# Patient Record
Sex: Female | Born: 1975
Health system: Southern US, Community
[De-identification: ages and names within clinical notes are randomized; demographics above are authoritative.]

## PROBLEM LIST (undated history)

## (undated) DIAGNOSIS — I10 Essential (primary) hypertension: Secondary | ICD-10-CM

## (undated) DIAGNOSIS — F419 Anxiety disorder, unspecified: Secondary | ICD-10-CM

## (undated) DIAGNOSIS — J45909 Unspecified asthma, uncomplicated: Secondary | ICD-10-CM

## (undated) HISTORY — DX: Unspecified asthma, uncomplicated: J45.909

---

## 1997-07-29 ENCOUNTER — Emergency Department (HOSPITAL_COMMUNITY): Admission: EM | Admit: 1997-07-29 | Discharge: 1997-07-29 | Payer: Self-pay | Admitting: Emergency Medicine

## 1997-08-01 ENCOUNTER — Emergency Department (HOSPITAL_COMMUNITY): Admission: EM | Admit: 1997-08-01 | Discharge: 1997-08-01 | Payer: Self-pay | Admitting: Emergency Medicine

## 2000-09-12 ENCOUNTER — Other Ambulatory Visit: Admission: RE | Admit: 2000-09-12 | Discharge: 2000-09-12 | Payer: Self-pay | Admitting: *Deleted

## 2000-10-05 ENCOUNTER — Emergency Department (HOSPITAL_COMMUNITY): Admission: EM | Admit: 2000-10-05 | Discharge: 2000-10-05 | Payer: Self-pay | Admitting: Emergency Medicine

## 2000-10-21 ENCOUNTER — Emergency Department (HOSPITAL_COMMUNITY): Admission: EM | Admit: 2000-10-21 | Discharge: 2000-10-21 | Payer: Self-pay | Admitting: Emergency Medicine

## 2000-10-21 ENCOUNTER — Encounter: Payer: Self-pay | Admitting: Emergency Medicine

## 2001-12-15 ENCOUNTER — Other Ambulatory Visit: Admission: RE | Admit: 2001-12-15 | Discharge: 2001-12-15 | Payer: Self-pay | Admitting: Obstetrics and Gynecology

## 2002-12-09 ENCOUNTER — Other Ambulatory Visit: Admission: RE | Admit: 2002-12-09 | Discharge: 2002-12-09 | Payer: Self-pay | Admitting: Obstetrics and Gynecology

## 2004-02-15 ENCOUNTER — Other Ambulatory Visit: Admission: RE | Admit: 2004-02-15 | Discharge: 2004-02-15 | Payer: Self-pay | Admitting: Obstetrics and Gynecology

## 2005-03-19 ENCOUNTER — Other Ambulatory Visit: Admission: RE | Admit: 2005-03-19 | Discharge: 2005-03-19 | Payer: Self-pay | Admitting: Obstetrics and Gynecology

## 2005-04-05 ENCOUNTER — Encounter: Admission: RE | Admit: 2005-04-05 | Discharge: 2005-04-05 | Payer: Self-pay | Admitting: Nurse Practitioner

## 2006-11-04 ENCOUNTER — Emergency Department (HOSPITAL_COMMUNITY): Admission: EM | Admit: 2006-11-04 | Discharge: 2006-11-05 | Payer: Self-pay | Admitting: Emergency Medicine

## 2006-11-04 IMAGING — CR DG CHEST 2V
2 series · 2 of 2 positions shown · non-contrast
Comparison: None.

CLINICAL DATA: Chest pain. Back pain. Cough.

CHEST - 2 VIEW

[w chest pa]
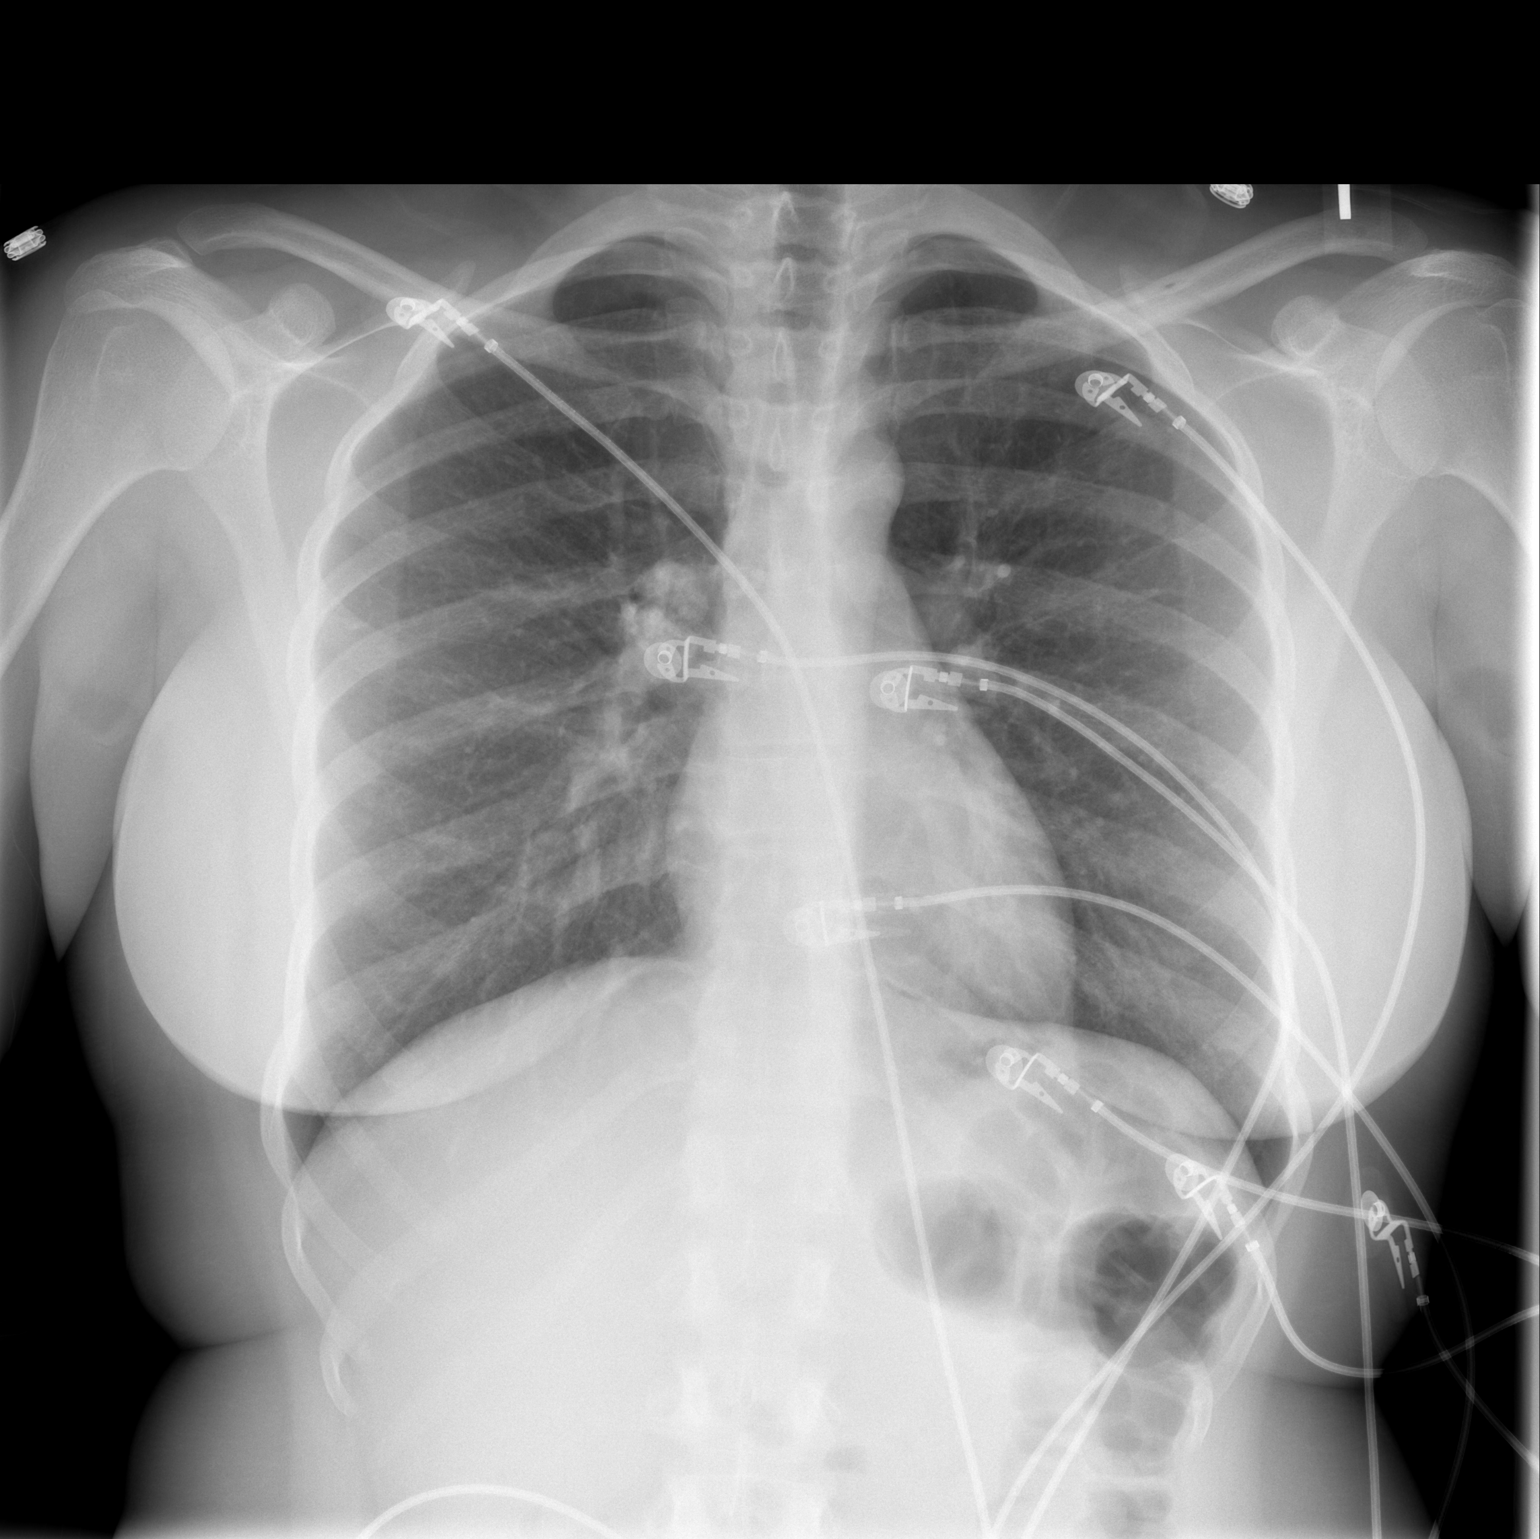

[w chest lat]
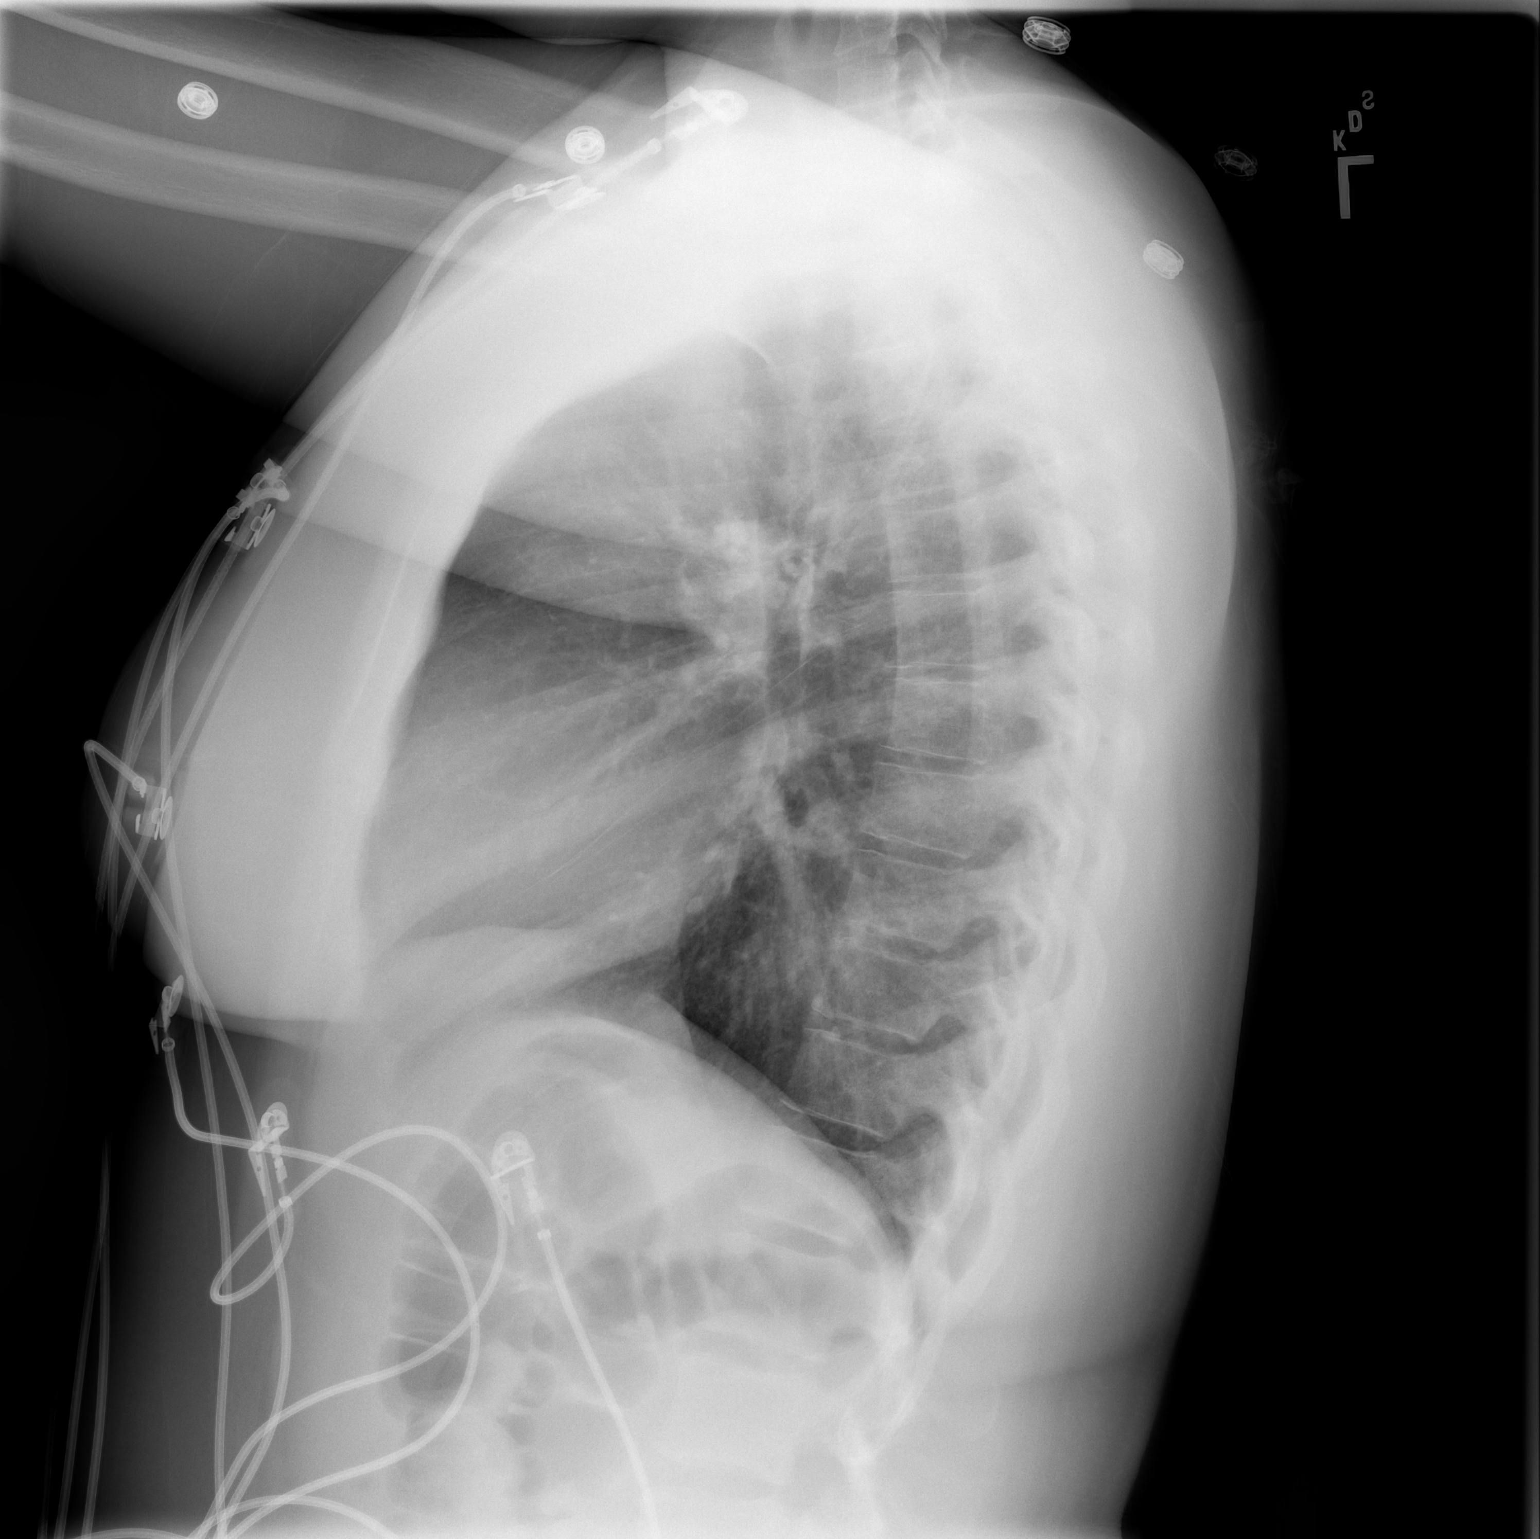

[2 of 2 positions shown; findings below may reference images not displayed]

FINDINGS: Normal sized heart. Calcified proximal right hilar/azygos lymph
nodes. Probable small calcified granuloma in the lateral aspect of the right
upper lobe, inferiorly. No airspace consolidation. Unremarkable bones.

IMPRESSION

Previous granulomatous infection. No acute abnormality.

## 2008-05-28 ENCOUNTER — Emergency Department (HOSPITAL_COMMUNITY): Admission: EM | Admit: 2008-05-28 | Discharge: 2008-05-28 | Payer: Self-pay | Admitting: Emergency Medicine

## 2008-05-28 IMAGING — CT CT HEAD W/O CM
1 of 2 series · 13 of 30 positions shown, 17 images · non-contrast
Comparison: None

CLINICAL DATA: Nausea.  Dizziness.

CT HEAD WITHOUT CONTRAST
TECHNIQUE: Contiguous axial images were obtained from the base of
the skull through the vertex without contrast

[Series 2: brain · axial · 0.47mm/px · z∈[+112,+244]mm · 13 of 40 slices shown, 17 images]
[im 3/40  brain]
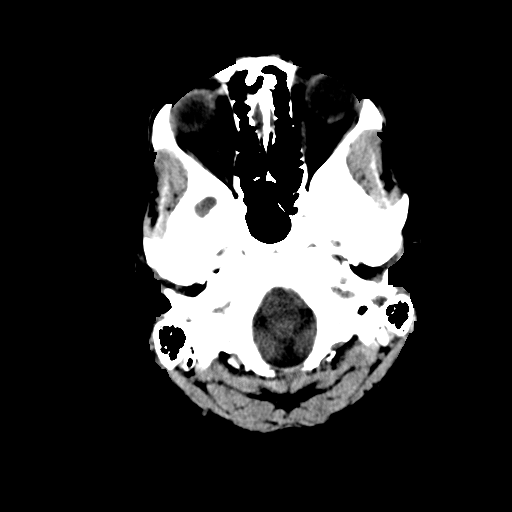
[im 3/40  bone]
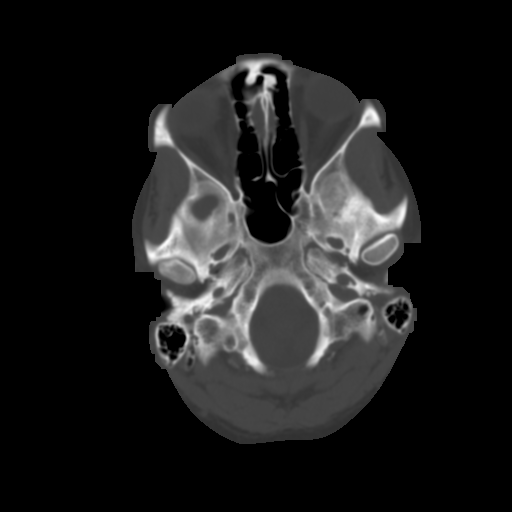
[im 6/40  brain]
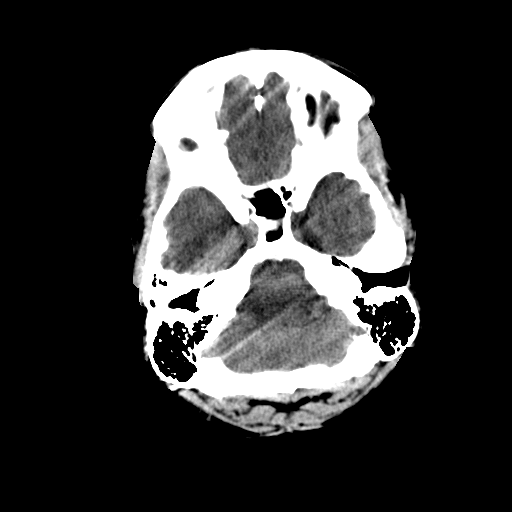
[im 9/40  brain]
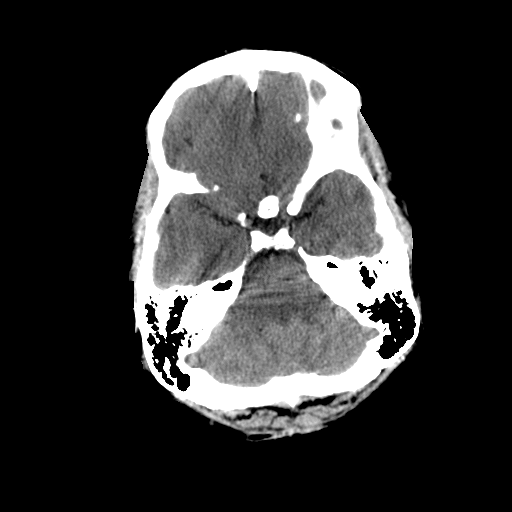
[im 12/40  brain]
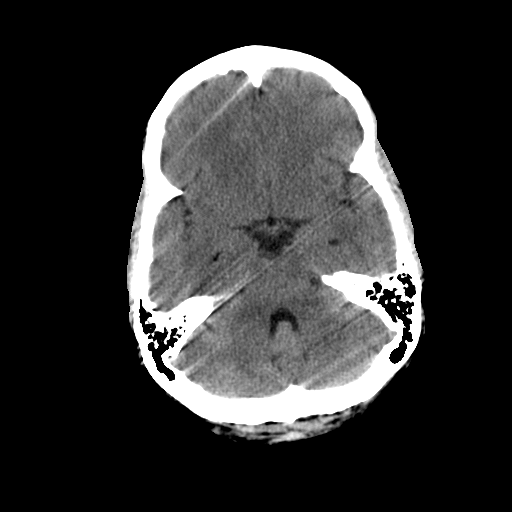
[im 14/40  brain]
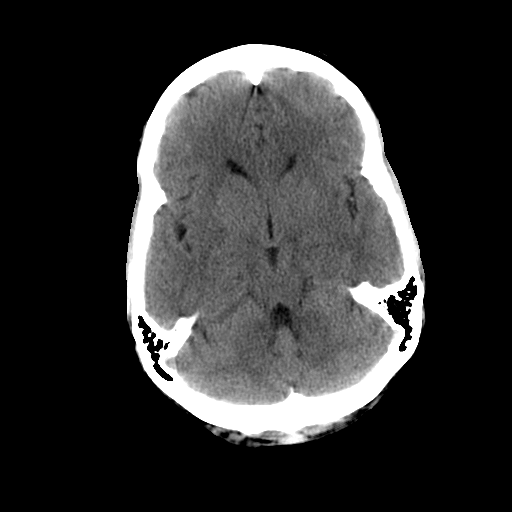
[im 14/40  bone]
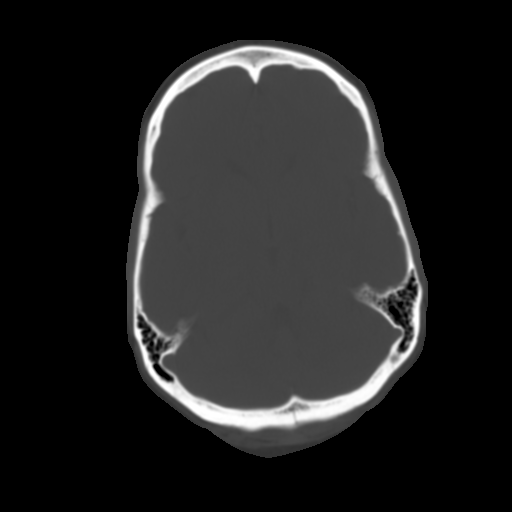
[im 17/40  brain]
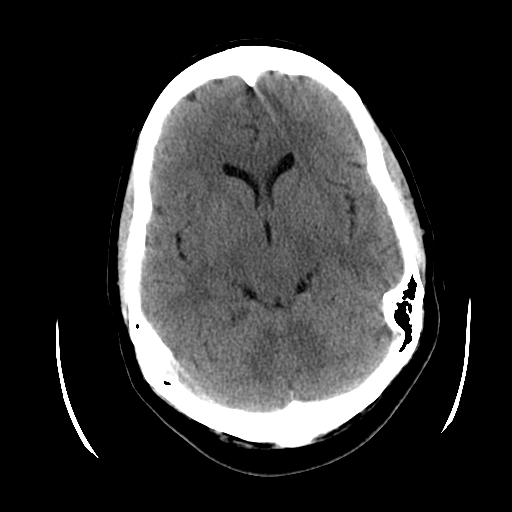
[im 20/40  brain]
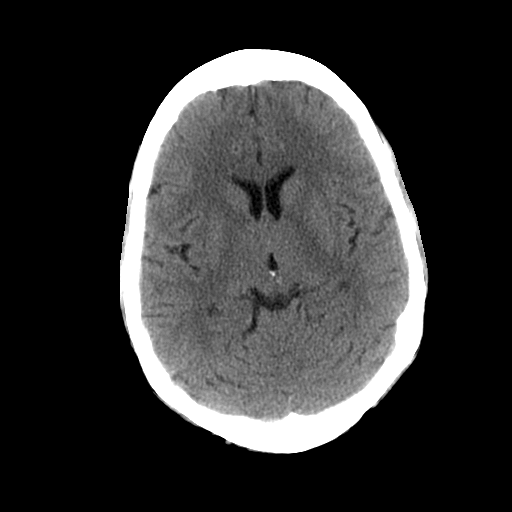
[im 23/40  brain]
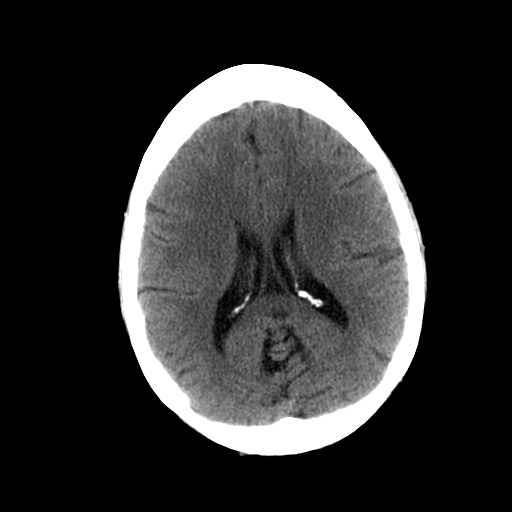
[im 26/40  brain]
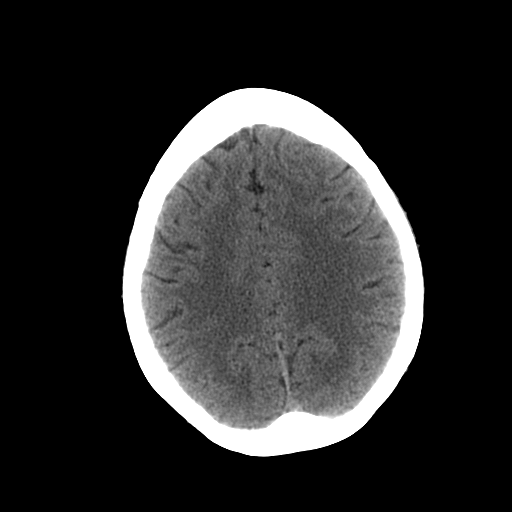
[im 26/40  bone]
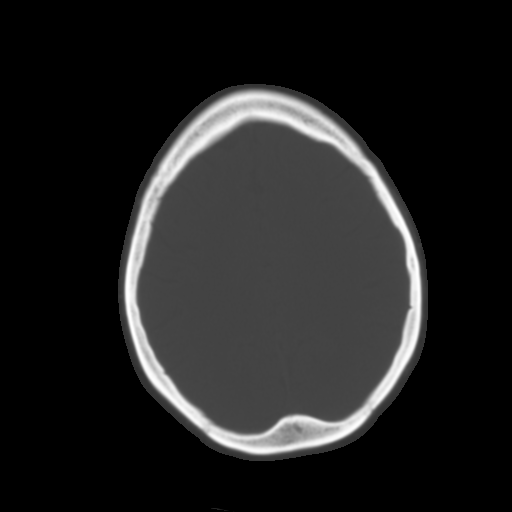
[im 28/40  brain]
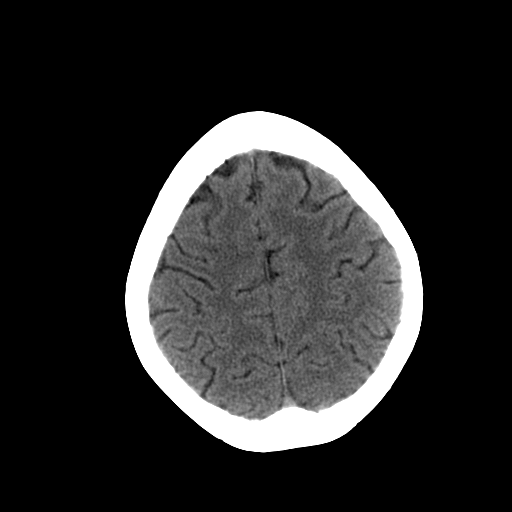
[im 31/40  brain]
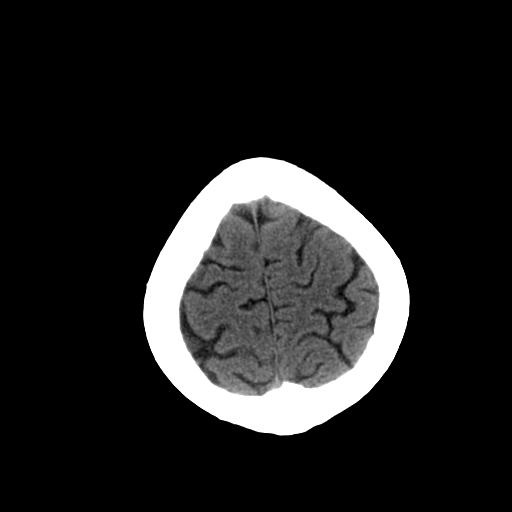
[im 34/40  brain]
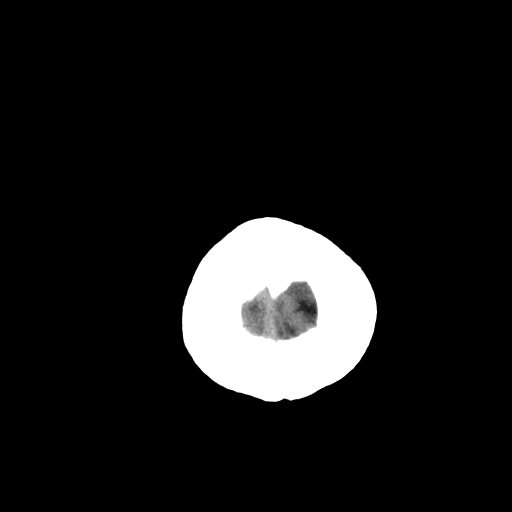
[im 37/40  brain]
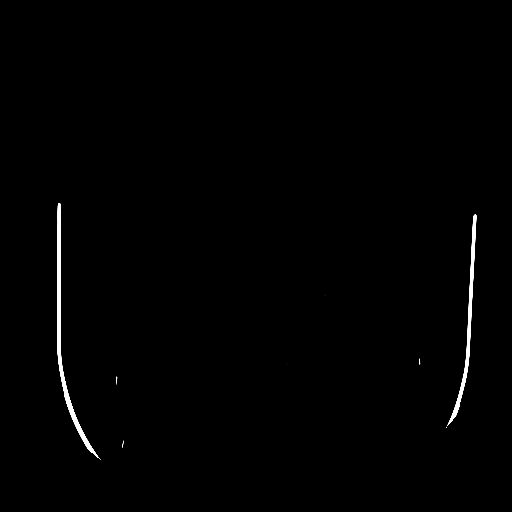
[im 37/40  bone]
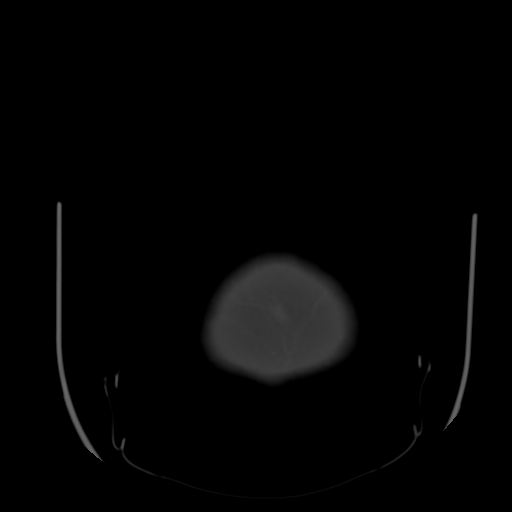

[13 of 30 positions shown; findings below may reference images not displayed]

FINDINGS: The brain has a normal appearance without evidence for
hemorrhage, acute infarction, hydrocephalus, or mass lesion.  There
is no extra axial fluid collection.  The skull and paranasal
sinuses are normal.
IMPRESSION: Normal CT of the head without contrast.

## 2008-06-13 ENCOUNTER — Encounter: Admission: RE | Admit: 2008-06-13 | Discharge: 2008-06-13 | Payer: Self-pay | Admitting: Family Medicine

## 2008-06-13 IMAGING — US US ABDOMEN COMPLETE
1 series · 14 of 25 positions shown · non-contrast
Comparison: None

CLINICAL DATA: Nausea, gallstones.

COMPLETE ABDOMINAL ULTRASOUND

[Series 1: us abdomen complete · 0.24mm/px · 14 of 70 slices shown]
[im 1/70]
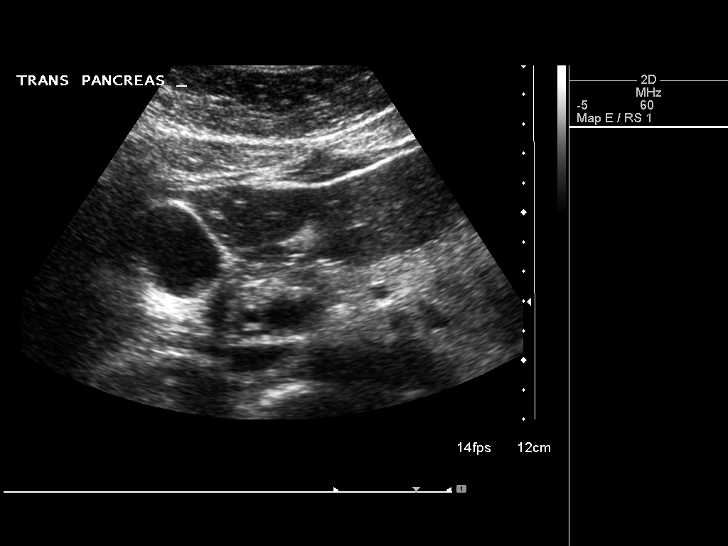
[im 6/70]
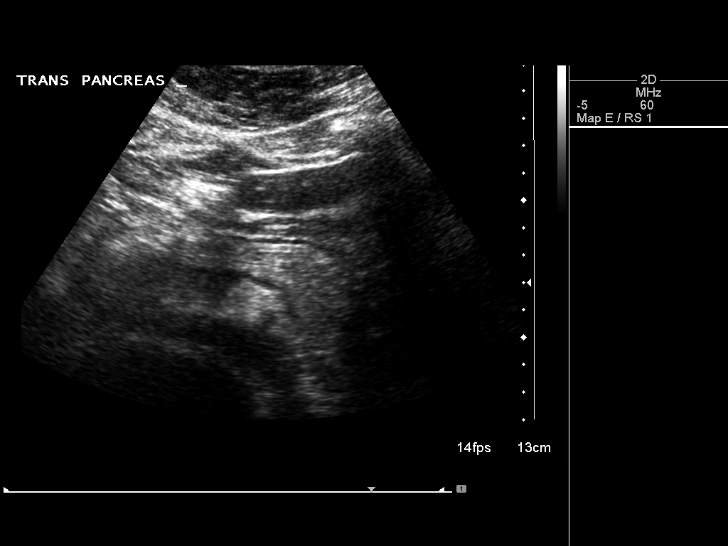
[im 12/70]
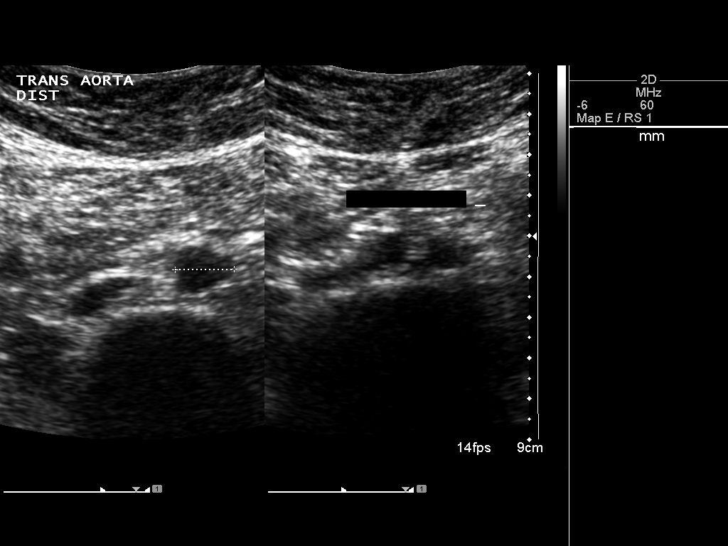
[im 18/70]
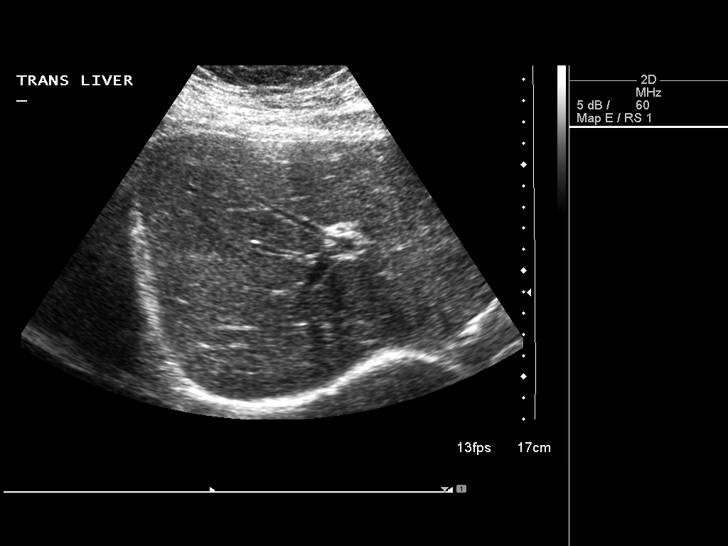
[im 24/70]
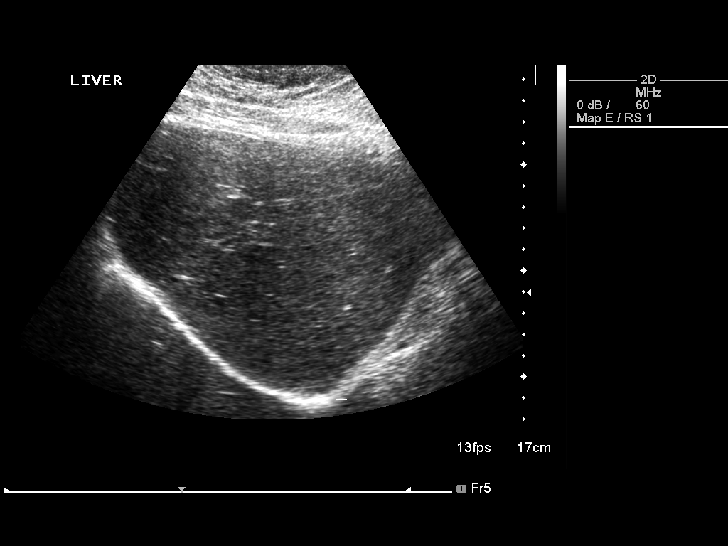
[im 26/70]
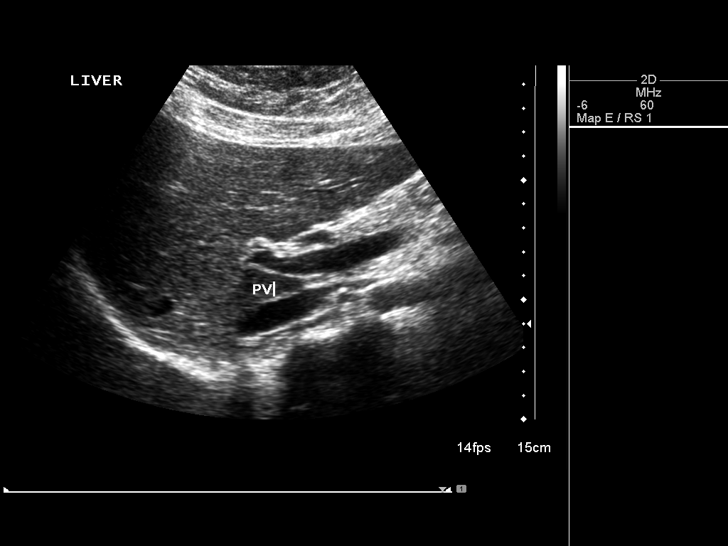
[im 32/70]
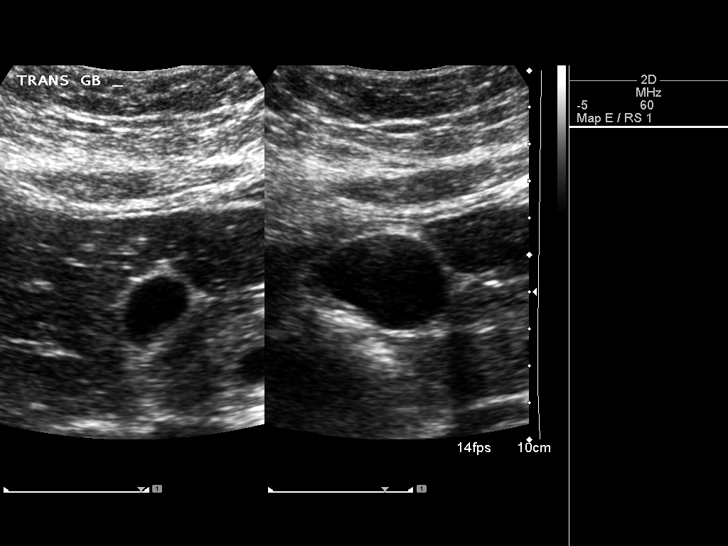
[im 38/70]
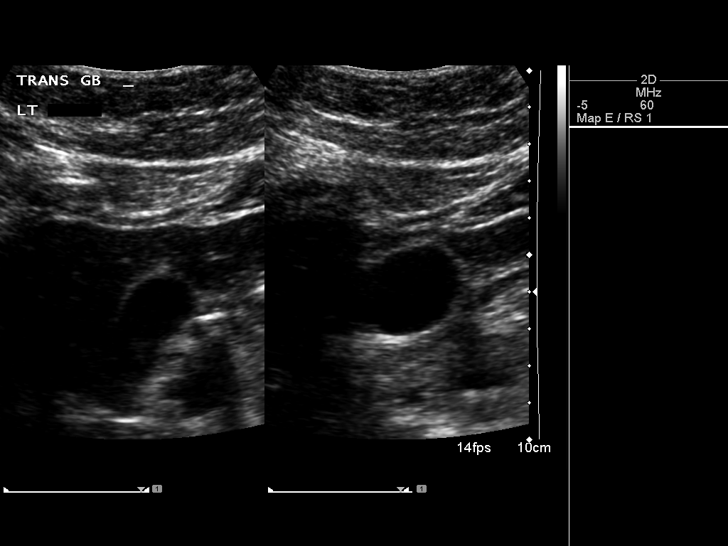
[im 44/70]
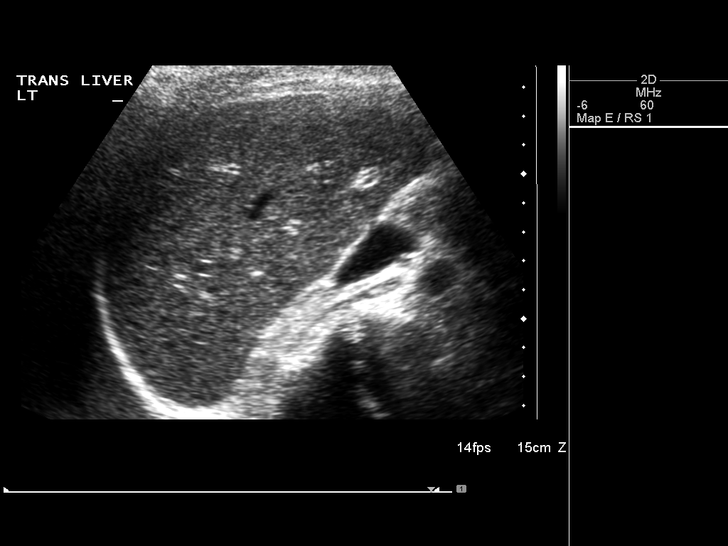
[im 47/70]
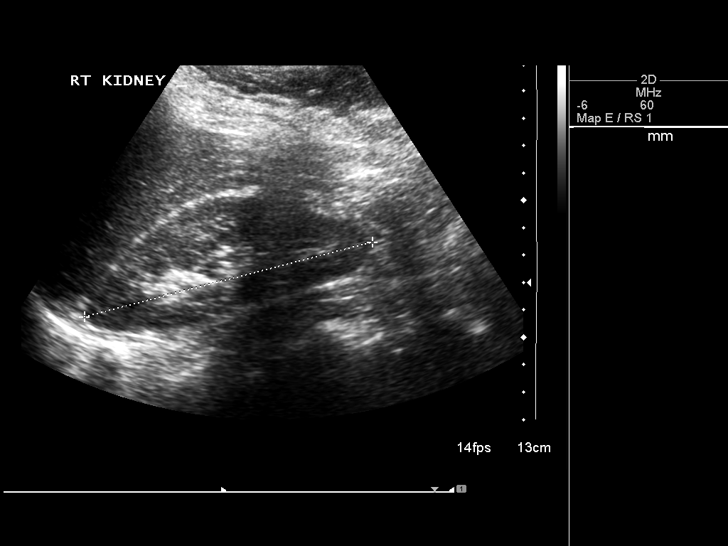
[im 52/70]
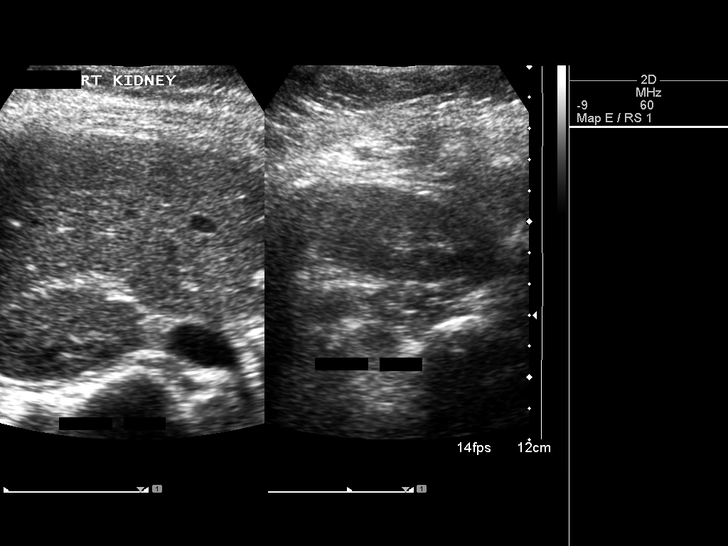
[im 58/70]
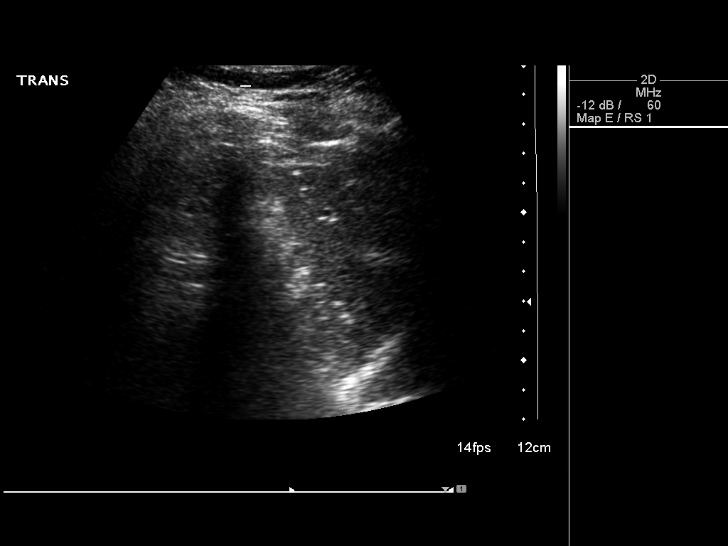
[im 64/70]
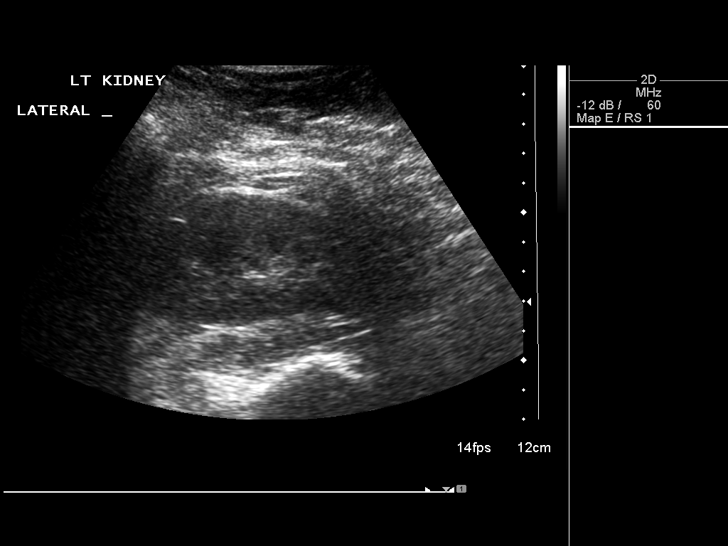
[im 70/70]
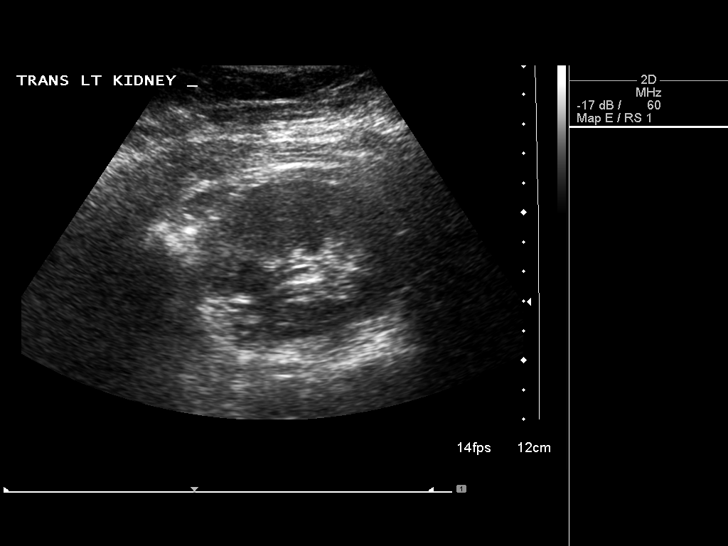

[14 of 25 positions shown; findings below may reference images not displayed]

FINDINGS: Gallbladder:  Negative.

Common bile duct:  4 mm, within normal limits.

Liver:  Negative.

IVC:  Visualized.

Pancreas:  Negative.

Spleen:  4.1 cm.  Contains calcified granulomas.

Right Kidney:  10.9 cm, negative.

Left Kidney:  10.9 cm, negative.

Abdominal aorta:  Measures up to 2.1 cm.
IMPRESSION: No acute findings.

## 2008-06-18 ENCOUNTER — Emergency Department (HOSPITAL_COMMUNITY): Admission: EM | Admit: 2008-06-18 | Discharge: 2008-06-18 | Payer: Self-pay | Admitting: Emergency Medicine

## 2008-08-12 ENCOUNTER — Ambulatory Visit (HOSPITAL_COMMUNITY): Admission: RE | Admit: 2008-08-12 | Discharge: 2008-08-12 | Payer: Self-pay | Admitting: Gastroenterology

## 2010-04-17 LAB — URINALYSIS, ROUTINE W REFLEX MICROSCOPIC
Leukocytes, UA: NEGATIVE
Nitrite: NEGATIVE
Specific Gravity, Urine: 1.01 (ref 1.005–1.030)
Urobilinogen, UA: 1 mg/dL (ref 0.0–1.0)

## 2010-04-17 LAB — DIFFERENTIAL
Basophils Absolute: 0 10*3/uL (ref 0.0–0.1)
Lymphocytes Relative: 26 % (ref 12–46)
Neutro Abs: 6.1 10*3/uL (ref 1.7–7.7)
Neutrophils Relative %: 66 % (ref 43–77)

## 2010-04-17 LAB — URINE MICROSCOPIC-ADD ON

## 2010-04-17 LAB — POCT I-STAT, CHEM 8
BUN: 6 mg/dL (ref 6–23)
Chloride: 108 mEq/L (ref 96–112)
HCT: 40 % (ref 36.0–46.0)
Potassium: 3.8 mEq/L (ref 3.5–5.1)
Sodium: 137 mEq/L (ref 135–145)

## 2010-04-17 LAB — POCT PREGNANCY, URINE: Preg Test, Ur: NEGATIVE

## 2010-04-17 LAB — CBC
Hemoglobin: 12.9 g/dL (ref 12.0–15.0)
Platelets: 345 10*3/uL (ref 150–400)
RDW: 12.3 % (ref 11.5–15.5)

## 2010-10-17 LAB — D-DIMER, QUANTITATIVE: D-Dimer, Quant: 0.31

## 2015-06-15 ENCOUNTER — Encounter (HOSPITAL_COMMUNITY): Payer: Self-pay

## 2015-06-15 ENCOUNTER — Emergency Department (HOSPITAL_COMMUNITY): Payer: 59

## 2015-06-15 DIAGNOSIS — R202 Paresthesia of skin: Secondary | ICD-10-CM | POA: Diagnosis not present

## 2015-06-15 DIAGNOSIS — Z79899 Other long term (current) drug therapy: Secondary | ICD-10-CM | POA: Insufficient documentation

## 2015-06-15 DIAGNOSIS — F419 Anxiety disorder, unspecified: Secondary | ICD-10-CM | POA: Diagnosis not present

## 2015-06-15 DIAGNOSIS — I10 Essential (primary) hypertension: Secondary | ICD-10-CM | POA: Diagnosis not present

## 2015-06-15 DIAGNOSIS — R072 Precordial pain: Secondary | ICD-10-CM | POA: Insufficient documentation

## 2015-06-15 LAB — DIFFERENTIAL
BASOS ABS: 0 10*3/uL (ref 0.0–0.1)
Basophils Relative: 0 %
EOS ABS: 0.3 10*3/uL (ref 0.0–0.7)
EOS PCT: 3 %
LYMPHS ABS: 4.6 10*3/uL — AB (ref 0.7–4.0)
LYMPHS PCT: 44 %
Monocytes Absolute: 0.5 10*3/uL (ref 0.1–1.0)
Monocytes Relative: 5 %
NEUTROS PCT: 48 %
Neutro Abs: 5.1 10*3/uL (ref 1.7–7.7)

## 2015-06-15 LAB — COMPREHENSIVE METABOLIC PANEL
ALBUMIN: 4.1 g/dL (ref 3.5–5.0)
ALT: 10 U/L — ABNORMAL LOW (ref 14–54)
ANION GAP: 12 (ref 5–15)
AST: 15 U/L (ref 15–41)
Alkaline Phosphatase: 90 U/L (ref 38–126)
BILIRUBIN TOTAL: 0.4 mg/dL (ref 0.3–1.2)
CO2: 20 mmol/L — AB (ref 22–32)
Calcium: 9.7 mg/dL (ref 8.9–10.3)
Chloride: 103 mmol/L (ref 101–111)
Creatinine, Ser: 0.8 mg/dL (ref 0.44–1.00)
GFR calc Af Amer: >60 mL/min (ref >60)
GFR calc non Af Amer: >60 mL/min (ref >60)
GLUCOSE: 87 mg/dL (ref 65–99)
POTASSIUM: 3.3 mmol/L — AB (ref 3.5–5.1)
SODIUM: 135 mmol/L (ref 135–145)
TOTAL PROTEIN: 7.7 g/dL (ref 6.5–8.1)

## 2015-06-15 LAB — CBC
HCT: 38.9 % (ref 36.0–46.0)
HEMOGLOBIN: 12.7 g/dL (ref 12.0–15.0)
MCH: 26.7 pg (ref 26.0–34.0)
MCHC: 32.6 g/dL (ref 30.0–36.0)
MCV: 81.7 fL (ref 78.0–100.0)
PLATELETS: 403 10*3/uL — AB (ref 150–400)
RBC: 4.76 MIL/uL (ref 3.87–5.11)
RDW: 12.3 % (ref 11.5–15.5)
WBC: 10.5 10*3/uL (ref 4.0–10.5)

## 2015-06-15 LAB — I-STAT CHEM 8, ED
BUN: 5 mg/dL — AB (ref 6–20)
CREATININE: 0.7 mg/dL (ref 0.44–1.00)
Calcium, Ion: 1.13 mmol/L (ref 1.12–1.23)
Chloride: 103 mmol/L (ref 101–111)
Glucose, Bld: 83 mg/dL (ref 65–99)
HEMATOCRIT: 42 % (ref 36.0–46.0)
HEMOGLOBIN: 14.3 g/dL (ref 12.0–15.0)
POTASSIUM: 3.1 mmol/L — AB (ref 3.5–5.1)
Sodium: 139 mmol/L (ref 135–145)
TCO2: 21 mmol/L (ref 0–100)

## 2015-06-15 LAB — APTT: APTT: 31 s (ref 24–37)

## 2015-06-15 LAB — PROTIME-INR
INR: 1.03 (ref 0.00–1.49)
PROTHROMBIN TIME: 13.7 s (ref 11.6–15.2)

## 2015-06-15 LAB — I-STAT TROPONIN, ED: Troponin i, poc: 0 ng/mL (ref 0.00–0.08)

## 2015-06-15 IMAGING — CT CT HEAD W/O CM
3 series · 15 of 47 positions shown, 18 images · non-contrast
Comparison: Head CT dated [DATE]

CLINICAL DATA: 39-year-old female with left-sided numbness

EXAM:
CT HEAD WITHOUT CONTRAST
TECHNIQUE: Contiguous axial images were obtained from the base of the skull
through the vertex without intravenous contrast.

[Series 2: head 5.0 st · axial · 0.43mm/px · z∈[-157,-27]mm · 9 of 32 slices shown, 12 images]
[im 3/32  brain]
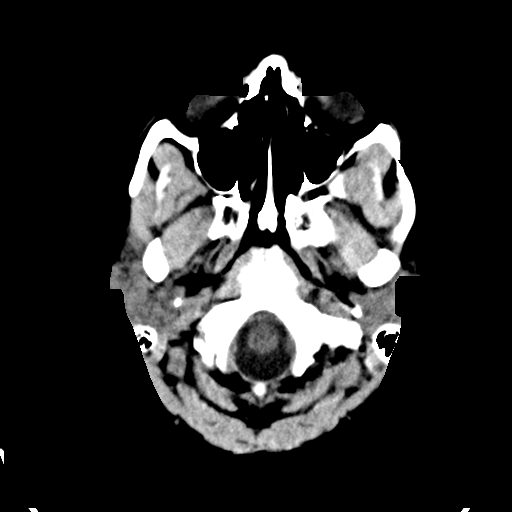
[im 3/32  bone]
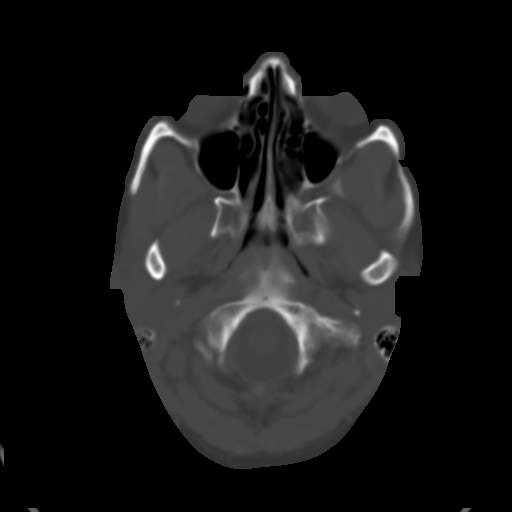
[im 6/32  brain]
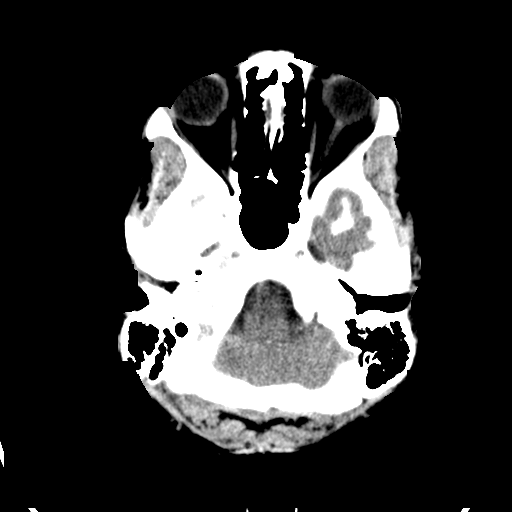
[im 9/32  brain]
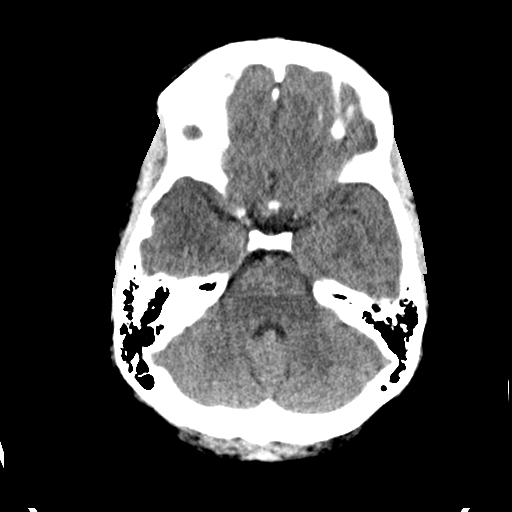
[im 12/32  brain]
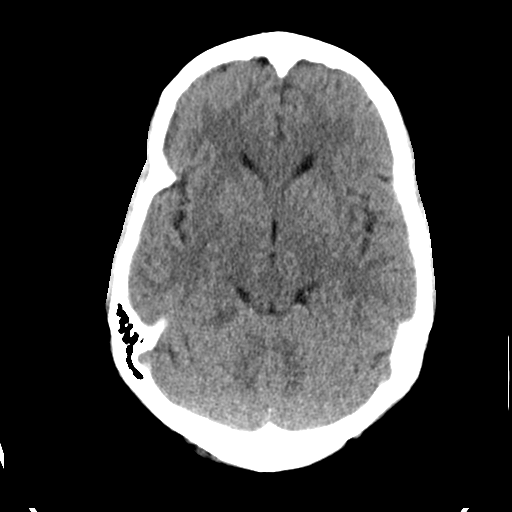
[im 17/32  brain]
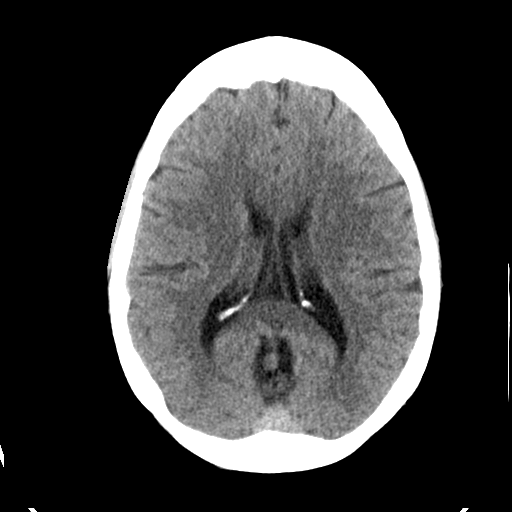
[im 17/32  bone]
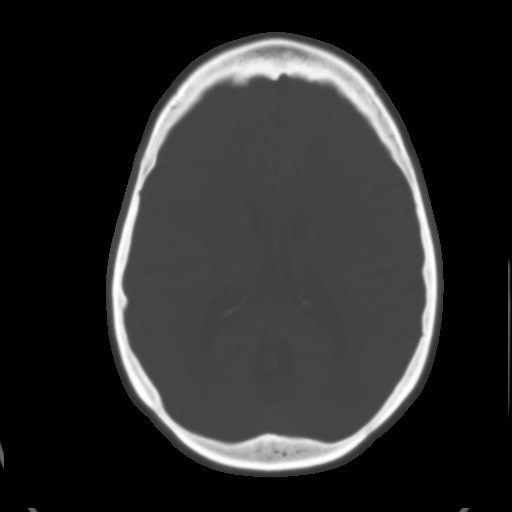
[im 20/32  brain]
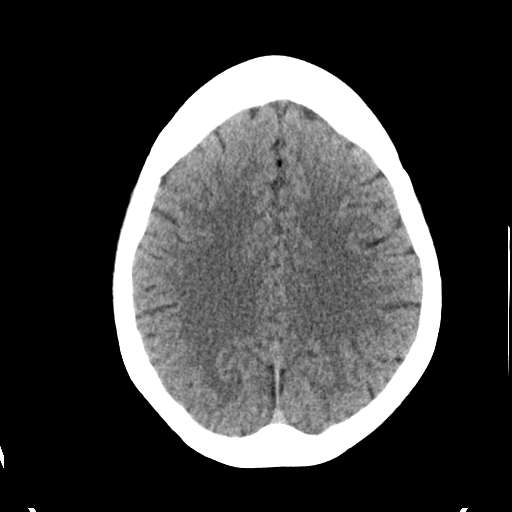
[im 23/32  brain]
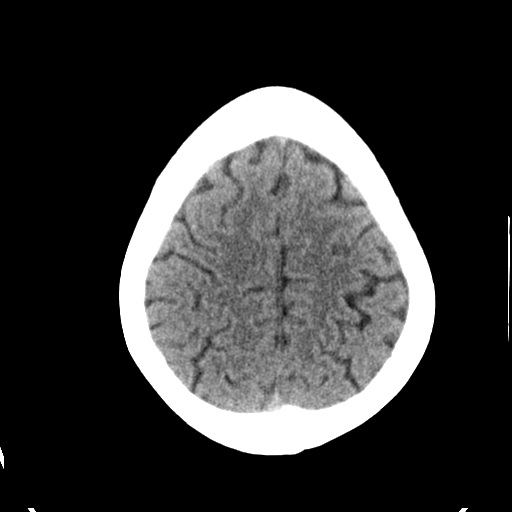
[im 26/32  brain]
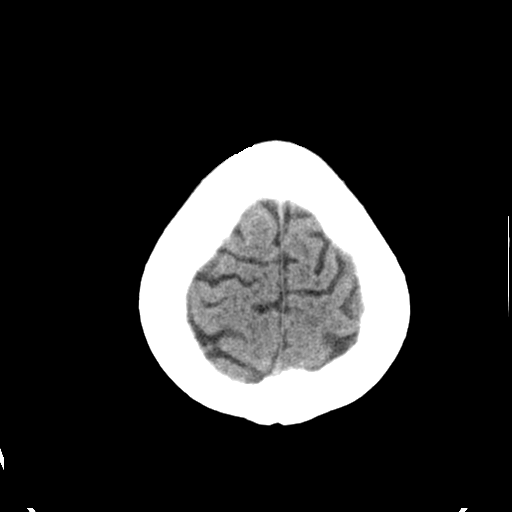
[im 29/32  brain]
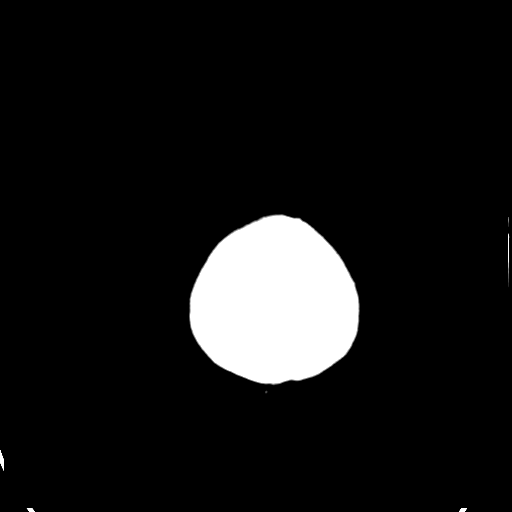
[im 29/32  bone]
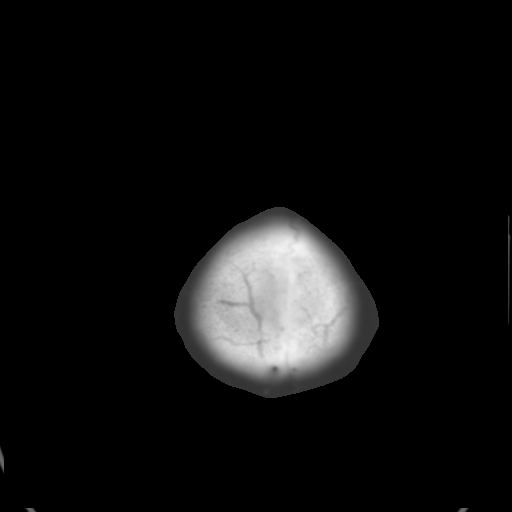

[Series 4: head 3.0 cor st · coronal · 0.31mm/px · 3 of 69 slices shown]
[im 23/69  brain]
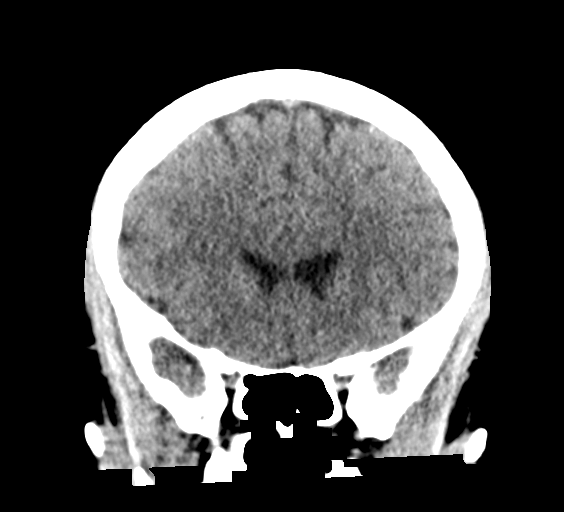
[im 31/69  brain]
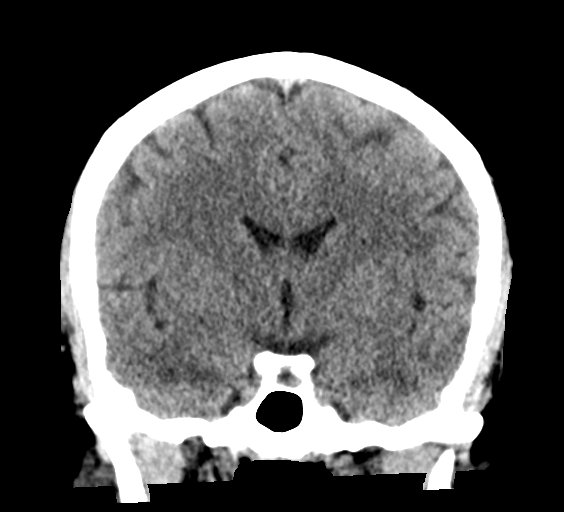
[im 38/69  brain]
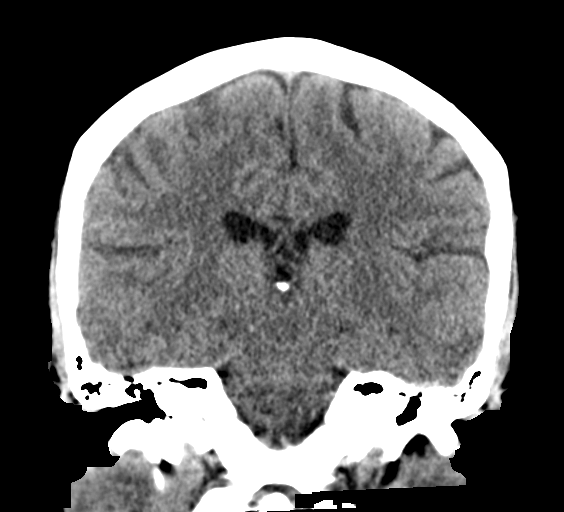

[Series 5: head 3.0 sag st · sagittal · 0.32mm/px · 3 of 67 slices shown]
[im 23/67  brain]
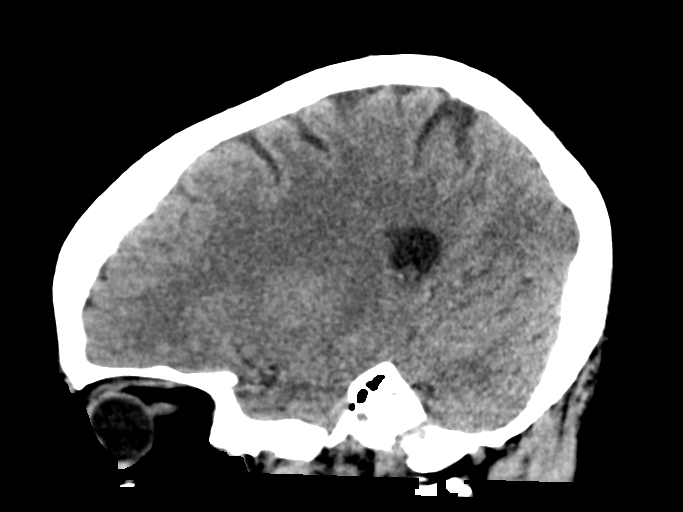
[im 34/67  brain]
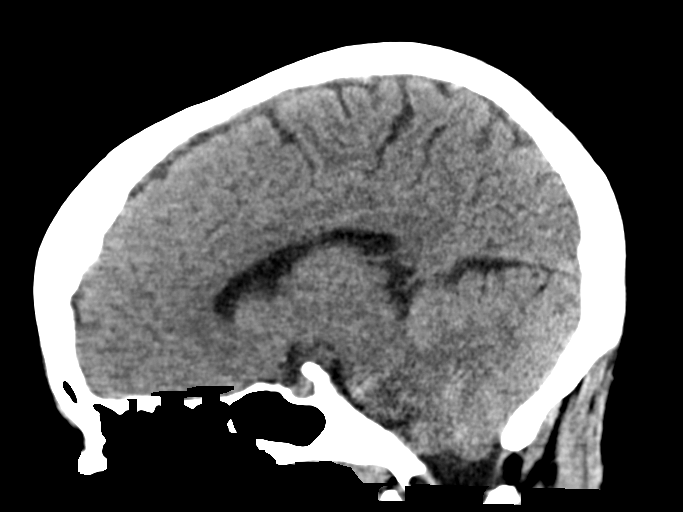
[im 45/67  brain]
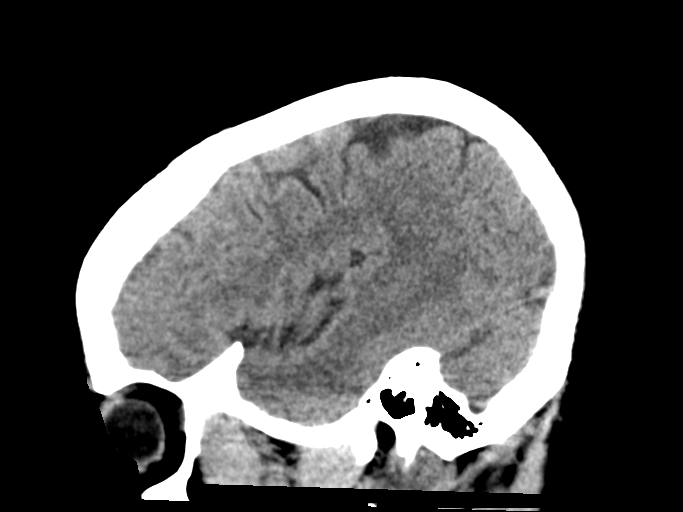

[15 of 47 positions shown; findings below may reference images not displayed]

FINDINGS: The ventricles and the sulci are appropriate in size for the
patient's age. There is no intracranial hemorrhage. No midline shift
or mass effect identified. The gray-white matter differentiation is
preserved.

The visualized paranasal sinuses and mastoid air cells are well
aerated. The calvarium is intact.
IMPRESSION: No acute intracranial pathology

## 2015-06-15 NOTE — ED Notes (Signed)
Pt states that at 8am the left side of her body started to feel numb. She went to work anyway and started to have chest pressure to center of chest. She reports she stills feels tingly but does have anxiety.

## 2015-06-16 ENCOUNTER — Emergency Department (HOSPITAL_COMMUNITY)
Admission: EM | Admit: 2015-06-16 | Discharge: 2015-06-16 | Disposition: A | Discharge status: Home / Self Care | Payer: 59 | Attending: Emergency Medicine | Admitting: Emergency Medicine

## 2015-06-16 DIAGNOSIS — F419 Anxiety disorder, unspecified: Secondary | ICD-10-CM

## 2015-06-16 DIAGNOSIS — R079 Chest pain, unspecified: Secondary | ICD-10-CM

## 2015-06-16 HISTORY — DX: Essential (primary) hypertension: I10

## 2015-06-16 HISTORY — DX: Anxiety disorder, unspecified: F41.9

## 2015-06-16 MED ORDER — ALPRAZOLAM 0.5 MG PO TABS: 0.5000 mg | ORAL_TABLET | Freq: Three times a day (TID) | ORAL | Status: DC | PRN

## 2015-06-16 MED ORDER — GI COCKTAIL ~~LOC~~
30.0000 mL | Freq: Once | ORAL | Status: AC
  Administered 2015-06-16: 30 mL via ORAL
  Filled 2015-06-16: qty 30

## 2015-06-16 NOTE — Discharge Instructions (Signed)
Nonspecific Chest Pain  °Chest pain can be caused by many different conditions. There is always a chance that your pain could be related to something serious, such as a heart attack or a blood clot in your lungs. Chest pain can also be caused by conditions that are not life-threatening. If you have chest pain, it is very important to follow up with your health care provider. °CAUSES  °Chest pain can be caused by: °· Heartburn. °· Pneumonia or bronchitis. °· Anxiety or stress. °· Inflammation around your heart (pericarditis) or lung (pleuritis or pleurisy). °· A blood clot in your lung. °· A collapsed lung (pneumothorax). It can develop suddenly on its own (spontaneous pneumothorax) or from trauma to the chest. °· Shingles infection (varicella-zoster virus). °· Heart attack. °· Damage to the bones, muscles, and cartilage that make up your chest wall. This can include: °· Bruised bones due to injury. °· Strained muscles or cartilage due to frequent or repeated coughing or overwork. °· Fracture to one or more ribs. °· Sore cartilage due to inflammation (costochondritis). °RISK FACTORS  °Risk factors for chest pain may include: °· Activities that increase your risk for trauma or injury to your chest. °· Respiratory infections or conditions that cause frequent coughing. °· Medical conditions or overeating that can cause heartburn. °· Heart disease or family history of heart disease. °· Conditions or health behaviors that increase your risk of developing a blood clot. °· Having had chicken pox (varicella zoster). °SIGNS AND SYMPTOMS °Chest pain can feel like: °· Burning or tingling on the surface of your chest or deep in your chest. °· Crushing, pressure, aching, or squeezing pain. °· Dull or sharp pain that is worse when you move, cough, or take a deep breath. °· Pain that is also felt in your back, neck, shoulder, or arm, or pain that spreads to any of these areas. °Your chest pain may come and go, or it may stay  constant. °DIAGNOSIS °Lab tests or other studies may be needed to find the cause of your pain. Your health care provider may have you take a test called an ambulatory ECG (electrocardiogram). An ECG records your heartbeat patterns at the time the test is performed. You may also have other tests, such as: °· Transthoracic echocardiogram (TTE). During echocardiography, sound waves are used to create a picture of all of the heart structures and to look at how blood flows through your heart. °· Transesophageal echocardiogram (TEE). This is a more advanced imaging test that obtains images from inside your body. It allows your health care provider to see your heart in finer detail. °· Cardiac monitoring. This allows your health care provider to monitor your heart rate and rhythm in real time. °· Holter monitor. This is a portable device that records your heartbeat and can help to diagnose abnormal heartbeats. It allows your health care provider to track your heart activity for several days, if needed. °· Stress tests. These can be done through exercise or by taking medicine that makes your heart beat more quickly. °· Blood tests. °· Imaging tests. °TREATMENT  °Your treatment depends on what is causing your chest pain. Treatment may include: °· Medicines. These may include: °· Acid blockers for heartburn. °· Anti-inflammatory medicine. °· Pain medicine for inflammatory conditions. °· Antibiotic medicine, if an infection is present. °· Medicines to dissolve blood clots. °· Medicines to treat coronary artery disease. °· Supportive care for conditions that do not require medicines. This may include: °· Resting. °· Applying heat   or cold packs to injured areas. °· Limiting activities until pain decreases. °HOME CARE INSTRUCTIONS °· If you were prescribed an antibiotic medicine, finish it all even if you start to feel better. °· Avoid any activities that bring on chest pain. °· Do not use any tobacco products, including  cigarettes, chewing tobacco, or electronic cigarettes. If you need help quitting, ask your health care provider. °· Do not drink alcohol. °· Take medicines only as directed by your health care provider. °· Keep all follow-up visits as directed by your health care provider. This is important. This includes any further testing if your chest pain does not go away. °· If heartburn is the cause for your chest pain, you may be told to keep your head raised (elevated) while sleeping. This reduces the chance that acid will go from your stomach into your esophagus. °· Make lifestyle changes as directed by your health care provider. These may include: °· Getting regular exercise. Ask your health care provider to suggest some activities that are safe for you. °· Eating a heart-healthy diet. A registered dietitian can help you to learn healthy eating options. °· Maintaining a healthy weight. °· Managing diabetes, if necessary. °· Reducing stress. °SEEK MEDICAL CARE IF: °· Your chest pain does not go away after treatment. °· You have a rash with blisters on your chest. °· You have a fever. °SEEK IMMEDIATE MEDICAL CARE IF:  °· Your chest pain is worse. °· You have an increasing cough, or you cough up blood. °· You have severe abdominal pain. °· You have severe weakness. °· You faint. °· You have chills. °· You have sudden, unexplained chest discomfort. °· You have sudden, unexplained discomfort in your arms, back, neck, or jaw. °· You have shortness of breath at any time. °· You suddenly start to sweat, or your skin gets clammy. °· You feel nauseous or you vomit. °· You suddenly feel light-headed or dizzy. °· Your heart begins to beat quickly, or it feels like it is skipping beats. °These symptoms may represent a serious problem that is an emergency. Do not wait to see if the symptoms will go away. Get medical help right away. Call your local emergency services (911 in the [place] Do not drive yourself to the hospital. °  °This  information is not intended to replace advice given to you by your health care provider. Make sure you discuss any questions you have with your health care provider. °  °Document Released: 10/03/2004 Document Revised: 01/14/2014 Document Reviewed: 07/30/2013 °Elsevier Interactive Patient Education ©2016 [employer] ° °Panic Attacks °Panic attacks are sudden, short-lived surges of severe anxiety, fear, or discomfort. They may occur for no reason when you are relaxed, when you are anxious, or when you are sleeping. Panic attacks may occur for a number of reasons:  °· Healthy people occasionally have panic attacks in extreme, life-threatening situations, such as war or natural disasters. Normal anxiety is a protective mechanism of the body that helps us react to danger (fight or flight response). °· Panic attacks are often seen with anxiety disorders, such as panic disorder, social anxiety disorder, generalized anxiety disorder, and phobias. Anxiety disorders cause excessive or uncontrollable anxiety. They may interfere with your relationships or other life activities. °· Panic attacks are sometimes seen with other mental illnesses, such as depression and posttraumatic stress disorder. °· Certain medical conditions, prescription medicines, and drugs of abuse can cause panic attacks. °SYMPTOMS  °Panic attacks start suddenly, peak within 20 minutes, and are   accompanied by four or more of the following symptoms: °· Pounding heart or fast heart rate (palpitations). °· Sweating. °· Trembling or shaking. °· Shortness of breath or feeling smothered. °· Feeling choked. °· Chest pain or discomfort. °· Nausea or strange feeling in your stomach. °· Dizziness, light-headedness, or feeling like you will faint. °· Chills or hot flushes. °· Numbness or tingling in your lips or hands and feet. °· Feeling that things are not real or feeling that you are not yourself. °· Fear of losing control or going crazy. °· Fear of dying. °Some  of these symptoms can mimic serious medical conditions. For example, you may think you are having a heart attack. Although panic attacks can be very scary, they are not life threatening. °DIAGNOSIS  °Panic attacks are diagnosed through an assessment by your health care provider. Your health care provider will ask questions about your symptoms, such as where and when they occurred. Your health care provider will also ask about your medical history and use of alcohol and drugs, including prescription medicines. Your health care provider may order blood tests or other studies to rule out a serious medical condition. Your health care provider may refer you to a mental health professional for further evaluation. °TREATMENT  °· Most healthy people who have one or two panic attacks in an extreme, life-threatening situation will not require treatment. °· The treatment for panic attacks associated with anxiety disorders or other mental illness typically involves counseling with a mental health professional, medicine, or a combination of both. Your health care provider will help determine what treatment is best for you. °· Panic attacks due to physical illness usually go away with treatment of the illness. If prescription medicine is causing panic attacks, talk with your health care provider about stopping the medicine, decreasing the dose, or substituting another medicine. °· Panic attacks due to alcohol or drug abuse go away with abstinence. Some adults need professional help in order to stop drinking or using drugs. °HOME CARE INSTRUCTIONS  °· Take all medicines as directed by your health care provider.   °· Schedule and attend follow-up visits as directed by your health care provider. It is important to keep all your appointments. °SEEK MEDICAL CARE IF: °· You are not able to take your medicines as prescribed. °· Your symptoms do not improve or get worse. °SEEK IMMEDIATE MEDICAL CARE IF:  °· You experience panic attack  symptoms that are different than your usual symptoms. °· You have serious thoughts about hurting yourself or others. °· You are taking medicine for panic attacks and have a serious side effect. °MAKE SURE YOU: °· Understand these instructions. °· Will watch your condition. °· Will get help right away if you are not doing well or get worse. °  °This information is not intended to replace advice given to you by your health care provider. Make sure you discuss any questions you have with your health care provider. °  °Document Released: 12/24/2004 Document Revised: 12/29/2012 Document Reviewed: 08/07/2012 °Elsevier Interactive Patient Education ©2016 [employer] ° °

## 2015-06-16 NOTE — ED Notes (Signed)
Pt c/o being very nervous and anxious

## 2015-06-16 NOTE — ED Notes (Signed)
The pt is being treated for chest pain not a stroke

## 2015-06-16 NOTE — ED Notes (Signed)
The pt continues to be anxious.  She reports maybe a little  better

## 2015-06-16 NOTE — ED Notes (Signed)
The pt is anxious and since this am she has had numbness and tingling in both feet and hands and fingers sl discomfort now

## 2015-06-16 NOTE — ED Notes (Signed)
No tingling and numbness in  Both her hands and feet

## 2015-06-23 NOTE — ED Provider Notes (Signed)
CSN: [id number]     Arrival date & time 06/15/15  2144 History   First MD Initiated Contact with Patient 06/16/15 0117     Chief Complaint  Patient presents with  . Numbness  . Chest Pain     (Consider location/radiation/quality/duration/timing/severity/associated sxs/prior Treatment) HPI Comments: Patient presents with complaint of a tingling sensation to the left side of body that started this morning while at work. She experienced a pressure in her substernal chest at that time as well. Tingling persists moving to bilateral hands and feet. She reports a history of anxiety and that she is out of her medication. No fever, cough, vomiting. Symptoms have been persistent throughout the day.  Patient is a 40 y.o. female presenting with chest pain. The history is provided by the patient. No language interpreter was used.  Chest Pain Pain location:  L chest Associated symptoms: shortness of breath   Associated symptoms: no fever and no nausea     Past Medical History  Diagnosis Date  . Hypertension   . Anxiety    History reviewed. No pertinent past surgical history. No family history on file. Social History  Substance Use Topics  . Smoking status: Never Smoker   . Smokeless tobacco: None  . Alcohol Use: No   OB History    No data available     Review of Systems  Constitutional: Negative for fever and chills.  HENT: Negative.   Respiratory: Positive for shortness of breath.   Cardiovascular: Positive for chest pain.  Gastrointestinal: Negative.  Negative for nausea.  Musculoskeletal: Negative.   Skin: Negative.   Neurological:       Paresthesias.      Allergies  Review of patient's allergies indicates not on file.  Home Medications   Prior to Admission medications   Medication Sig Start Date End Date Taking? Authorizing Provider  ALPRAZolam Duanne Moron) 0.5 MG tablet Take 1 tablet (0.5 mg total) by mouth 3 (three) times daily as needed for anxiety. 06/16/15   Joshus Rogan, PA-C   BP 134/84 mmHg  Pulse 76  Temp(Src) 98.1 F (36.7 C) (Oral)  Resp 18  SpO2 99%  LMP 05/25/2015 Physical Exam  Constitutional: She is oriented to person, place, and time. She appears well-developed and well-nourished.  HENT:  Head: Normocephalic.  Neck: Normal range of motion. Neck supple.  Cardiovascular: Normal rate and regular rhythm.   Pulmonary/Chest: Effort normal and breath sounds normal. She has no wheezes. She has no rales.  Abdominal: Soft. Bowel sounds are normal. There is no tenderness. There is no rebound and no guarding.  Musculoskeletal: Normal range of motion.  Neurological: She is alert and oriented to person, place, and time.  No lateralizing weakness. Normal sensation to bilateral extremities to light touch.  Skin: Skin is warm and dry. No rash noted.  Psychiatric: She has a normal mood and affect.    ED Course  Procedures (including critical care time) Labs Review Labs Reviewed  CBC - Abnormal; Notable for the following:    Platelets 403 (*)    All other components within normal limits  DIFFERENTIAL - Abnormal; Notable for the following:    Lymphs Abs 4.6 (*)    All other components within normal limits  COMPREHENSIVE METABOLIC PANEL - Abnormal; Notable for the following:    Potassium 3.3 (*)    CO2 20 (*)    BUN <5 (*)    ALT 10 (*)    All other components within normal limits  I-STAT  CHEM 8, ED - Abnormal; Notable for the following:    Potassium 3.1 (*)    BUN 5 (*)    All other components within normal limits  PROTIME-INR  APTT  I-STAT TROPOININ, ED  I-STAT TROPOININ, ED    Imaging Review No results found. I have personally reviewed and evaluated these images and lab results as part of my medical decision-making.   EKG Interpretation   Date/Time:  Thursday June 15 2015 21:50:28 EDT Ventricular Rate:  98 PR Interval:  128 QRS Duration: 80 QT Interval:  360 QTC Calculation: 459 R Axis:   81 Text Interpretation:   Normal sinus rhythm Normal ECG When compared with  ECG of 05/28/2008, No significant change was found Confirmed by [facility]  MD,  DAVID ([id number]) on 06/15/2015 9:54:45 PM Also confirmed by Roxanne Mins  MD, DAVID  ([id number]), editor [place] CT, Leda Gauze [phone])  on 06/16/2015 8:28:45 AM      MDM   Final diagnoses:  Chest pain, unspecified chest pain type  Anxiety    The patient presents with chest pain similar to previous anxiety attacks. She is out of medications for same. EKG, lab studies are negative for acute findings. Feels she can be discharged home with Rx for limited number Xanax and PCP follow up.    Charlann Lange, PA-C 06/25/15 [id number]  Blanchie Dessert, MD 06/26/15 0111

## 2016-01-16 DIAGNOSIS — H66002 Acute suppurative otitis media without spontaneous rupture of ear drum, left ear: Secondary | ICD-10-CM | POA: Diagnosis not present

## 2016-01-16 DIAGNOSIS — J0140 Acute pansinusitis, unspecified: Secondary | ICD-10-CM | POA: Diagnosis not present

## 2016-01-16 DIAGNOSIS — R05 Cough: Secondary | ICD-10-CM | POA: Diagnosis not present

## 2016-02-02 DIAGNOSIS — L219 Seborrheic dermatitis, unspecified: Secondary | ICD-10-CM | POA: Diagnosis not present

## 2016-03-04 DIAGNOSIS — I10 Essential (primary) hypertension: Secondary | ICD-10-CM | POA: Diagnosis not present

## 2016-06-12 ENCOUNTER — Ambulatory Visit: Payer: 59 | Admitting: Family Medicine

## 2016-06-14 ENCOUNTER — Ambulatory Visit: Payer: 59 | Admitting: Family Medicine

## 2016-12-08 DIAGNOSIS — Z0100 Encounter for examination of eyes and vision without abnormal findings: Secondary | ICD-10-CM | POA: Diagnosis not present

## 2017-02-17 DIAGNOSIS — E669 Obesity, unspecified: Secondary | ICD-10-CM | POA: Diagnosis not present

## 2017-02-17 DIAGNOSIS — I10 Essential (primary) hypertension: Secondary | ICD-10-CM | POA: Diagnosis not present

## 2017-03-30 DIAGNOSIS — J019 Acute sinusitis, unspecified: Secondary | ICD-10-CM | POA: Diagnosis not present

## 2017-03-30 DIAGNOSIS — R05 Cough: Secondary | ICD-10-CM | POA: Diagnosis not present

## 2017-04-16 DIAGNOSIS — Z6829 Body mass index (BMI) 29.0-29.9, adult: Secondary | ICD-10-CM | POA: Diagnosis not present

## 2017-04-16 DIAGNOSIS — Z01419 Encounter for gynecological examination (general) (routine) without abnormal findings: Secondary | ICD-10-CM | POA: Diagnosis not present

## 2017-05-05 DIAGNOSIS — D251 Intramural leiomyoma of uterus: Secondary | ICD-10-CM | POA: Diagnosis not present

## 2017-09-09 DIAGNOSIS — R1904 Left lower quadrant abdominal swelling, mass and lump: Secondary | ICD-10-CM | POA: Diagnosis not present

## 2017-09-09 DIAGNOSIS — D251 Intramural leiomyoma of uterus: Secondary | ICD-10-CM | POA: Diagnosis not present

## 2017-11-14 NOTE — Patient Instructions (Addendum)
Your procedure is scheduled on: Tuesday, 11/26  Enter through the Main Entrance of [facility] at: 6 am  Pick up the phone at the desk and dial [phone].  Call this number if you have problems the morning of surgery: [phone].  Remember: Do NOT eat food or Do NOT drink clear liquids (including water) after midnight Monday  Take these medicines the morning of surgery with a SIP OF WATER: xanax if needed  Brush your teeth on the day of surgery.  Bring inhaler with you on day of surgery.  Do Not smoke on the day of surgery.  Stop herbal medications, vitamin supplements, Ibuprofen/NSAIDS at this time (or 1 week prior to surgery).  Do NOT wear jewelry (body piercing), metal hair clips/bobby pins, make-up, or nail polish. Do NOT wear lotions, powders, or perfumes.  You may wear deoderant. Do NOT shave for 48 hours prior to surgery. Do NOT bring valuables to the hospital. Contacts, dentures, or bridgework may not be worn into surgery.  Leave suitcase in car.  After surgery it may be brought to your room.  For patients admitted to the hospital, checkout time is 11:00 AM the day of discharge. Have a responsible adult drive you home and stay with you for 24 hours after your procedure.

## 2017-11-18 DIAGNOSIS — L218 Other seborrheic dermatitis: Secondary | ICD-10-CM | POA: Diagnosis not present

## 2017-11-19 DIAGNOSIS — N83202 Unspecified ovarian cyst, left side: Secondary | ICD-10-CM | POA: Insufficient documentation

## 2017-11-19 DIAGNOSIS — D219 Benign neoplasm of connective and other soft tissue, unspecified: Secondary | ICD-10-CM | POA: Diagnosis not present

## 2017-11-19 DIAGNOSIS — N852 Hypertrophy of uterus: Secondary | ICD-10-CM | POA: Insufficient documentation

## 2017-11-19 DIAGNOSIS — I10 Essential (primary) hypertension: Secondary | ICD-10-CM | POA: Insufficient documentation

## 2017-11-24 ENCOUNTER — Inpatient Hospital Stay (HOSPITAL_COMMUNITY)
Admission: RE | Admit: 2017-11-24 | Discharge: 2017-11-24 | Disposition: A | Discharge status: Home / Self Care | Payer: 59 | Source: Ambulatory Visit

## 2017-11-24 ENCOUNTER — Other Ambulatory Visit (HOSPITAL_COMMUNITY): Payer: 59

## 2017-12-02 ENCOUNTER — Inpatient Hospital Stay (HOSPITAL_COMMUNITY): Admission: RE | Admit: 2017-12-02 | Payer: 59 | Source: Ambulatory Visit | Admitting: Obstetrics and Gynecology

## 2017-12-02 SURGERY — HYSTERECTOMY, ABDOMINAL
Anesthesia: Choice | Laterality: Left

## 2018-01-07 ENCOUNTER — Ambulatory Visit (HOSPITAL_COMMUNITY)
Admission: EM | Admit: 2018-01-07 | Discharge: 2018-01-07 | Disposition: A | Discharge status: Home / Self Care | Payer: 59 | Attending: Family Medicine | Admitting: Family Medicine

## 2018-01-07 ENCOUNTER — Encounter (HOSPITAL_COMMUNITY): Payer: Self-pay | Admitting: Emergency Medicine

## 2018-01-07 DIAGNOSIS — J0110 Acute frontal sinusitis, unspecified: Secondary | ICD-10-CM | POA: Diagnosis not present

## 2018-01-07 DIAGNOSIS — J0100 Acute maxillary sinusitis, unspecified: Secondary | ICD-10-CM | POA: Insufficient documentation

## 2018-01-07 MED ORDER — AMOXICILLIN-POT CLAVULANATE 875-125 MG PO TABS: 1.0000 | ORAL_TABLET | Freq: Two times a day (BID) | ORAL | 0 refills | Status: DC

## 2018-01-07 NOTE — ED Triage Notes (Signed)
Pt c/o bilateral ear pain, congestion, drainage for several weeks.

## 2018-01-09 NOTE — ED Provider Notes (Signed)
[place]   [id number] 01/07/18 Arrival Time: 1884  ASSESSMENT & PLAN:  1. Acute non-recurrent frontal sinusitis    Meds ordered this encounter  Medications  . amoxicillin-clavulanate (AUGMENTIN) 875-125 MG tablet    Sig: Take 1 tablet by mouth every 12 (twelve) hours.    Dispense:  20 tablet    Refill:  0   Discussed typical duration of symptoms. OTC symptom care as needed. Ensure adequate fluid intake and rest.  Follow-up Information    Kathyrn Lass, MD.   Specialty:  Family Medicine Why:  As needed. Contact information: [place] [address] [phone]        [facility].   Specialty:  Urgent Care Why:  As needed. Contact information: [place] [phone]         Reviewed expectations re: course of current medical issues. Questions answered. Outlined signs and symptoms indicating need for more acute intervention. Patient verbalized understanding. After Visit Summary given.   SUBJECTIVE: History from: patient.  Alicia Russell is a 43 y.o. female who presents with complaint of nasal congestion, post-nasal drainage, and sinus pain. Onset gradual, greater than 2 weeks ago. Respiratory symptoms: none. Mild bilateral ear "pressure"; no pain. Fever: no. Overall normal PO intake without n/v. OTC treatment: decongestant without relief. History of frequent sinus infections: no. No specific aggravating or alleviating factors reported. Social History   Tobacco Use  Smoking Status Never Smoker    ROS: As per HPI.   OBJECTIVE:  Vitals:   01/07/18 1606  BP: (!) 155/89  Pulse: (!) 101  Resp: 16  Temp: 98.7 F (37.1 C)  TempSrc: Oral  SpO2: 100%     General appearance: alert; appears fatigued HEENT: nasal congestion; clear runny nose; throat irritation secondary to post-nasal drainage; bilateral frontal tenderness to palpation; turbinates  boggy Neck: supple without LAD; trachea midline Lungs: unlabored respirations, symmetrical air entry; cough: absent; no respiratory distress Skin: warm and dry Psychological: alert and cooperative; normal mood and affect  No Known Allergies  Past Medical History:  Diagnosis Date  . Anxiety   . Hypertension    No family history on file. Social History   Socioeconomic History  . Marital status: Single    Spouse name: Not on file  . Number of children: Not on file  . Years of education: Not on file  . Highest education level: Not on file  Occupational History  . Not on file  Social Needs  . Financial resource strain: Not on file  . Food insecurity:    Worry: Not on file    Inability: Not on file  . Transportation needs:    Medical: Not on file    Non-medical: Not on file  Tobacco Use  . Smoking status: Never Smoker  Substance and Sexual Activity  . Alcohol use: No  . Drug use: Not on file  . Sexual activity: Not on file  Lifestyle  . Physical activity:    Days per week: Not on file    Minutes per session: Not on file  . Stress: Not on file  Relationships  . Social connections:    Talks on phone: Not on file    Gets together: Not on file    Attends religious service: Not on file    Active member of club or organization: Not on file    Attends meetings of clubs or organizations: Not on file  Relationship status: Not on file  . Intimate partner violence:    Fear of current or ex partner: Not on file    Emotionally abused: Not on file    Physically abused: Not on file    Forced sexual activity: Not on file  Other Topics Concern  . Not on file  Social History Narrative  . Not on file            Vanessa Kick, MD 01/09/18 [phone]

## 2018-02-02 DIAGNOSIS — I10 Essential (primary) hypertension: Secondary | ICD-10-CM | POA: Diagnosis not present

## 2018-02-03 DIAGNOSIS — R112 Nausea with vomiting, unspecified: Secondary | ICD-10-CM | POA: Diagnosis not present

## 2018-02-03 DIAGNOSIS — R197 Diarrhea, unspecified: Secondary | ICD-10-CM | POA: Diagnosis not present

## 2018-02-17 DIAGNOSIS — R109 Unspecified abdominal pain: Secondary | ICD-10-CM | POA: Diagnosis not present

## 2018-02-25 DIAGNOSIS — K5904 Chronic idiopathic constipation: Secondary | ICD-10-CM | POA: Diagnosis not present

## 2018-02-25 DIAGNOSIS — K529 Noninfective gastroenteritis and colitis, unspecified: Secondary | ICD-10-CM | POA: Diagnosis not present

## 2018-03-03 DIAGNOSIS — I10 Essential (primary) hypertension: Secondary | ICD-10-CM | POA: Diagnosis not present

## 2018-03-03 DIAGNOSIS — J029 Acute pharyngitis, unspecified: Secondary | ICD-10-CM | POA: Diagnosis not present

## 2018-03-03 DIAGNOSIS — Z20828 Contact with and (suspected) exposure to other viral communicable diseases: Secondary | ICD-10-CM | POA: Diagnosis not present

## 2018-03-06 DIAGNOSIS — J029 Acute pharyngitis, unspecified: Secondary | ICD-10-CM | POA: Diagnosis not present

## 2018-11-25 ENCOUNTER — Other Ambulatory Visit: Payer: Self-pay

## 2018-11-25 DIAGNOSIS — Z20822 Contact with and (suspected) exposure to covid-19: Secondary | ICD-10-CM

## 2018-11-27 LAB — NOVEL CORONAVIRUS, NAA: SARS-CoV-2, NAA: NOT DETECTED

## 2019-01-19 ENCOUNTER — Other Ambulatory Visit: Payer: 59

## 2019-03-07 ENCOUNTER — Encounter (HOSPITAL_BASED_OUTPATIENT_CLINIC_OR_DEPARTMENT_OTHER): Payer: Self-pay

## 2019-03-07 ENCOUNTER — Other Ambulatory Visit: Payer: Self-pay

## 2019-03-07 ENCOUNTER — Emergency Department (HOSPITAL_BASED_OUTPATIENT_CLINIC_OR_DEPARTMENT_OTHER)
Admission: EM | Admit: 2019-03-07 | Discharge: 2019-03-07 | Disposition: A | Discharge status: Home / Self Care | Payer: 59 | Attending: Emergency Medicine | Admitting: Emergency Medicine

## 2019-03-07 ENCOUNTER — Emergency Department (HOSPITAL_BASED_OUTPATIENT_CLINIC_OR_DEPARTMENT_OTHER): Payer: 59

## 2019-03-07 DIAGNOSIS — Y92002 Bathroom of unspecified non-institutional (private) residence single-family (private) house as the place of occurrence of the external cause: Secondary | ICD-10-CM | POA: Diagnosis not present

## 2019-03-07 DIAGNOSIS — Y999 Unspecified external cause status: Secondary | ICD-10-CM | POA: Diagnosis not present

## 2019-03-07 DIAGNOSIS — I10 Essential (primary) hypertension: Secondary | ICD-10-CM | POA: Diagnosis not present

## 2019-03-07 DIAGNOSIS — W010XXA Fall on same level from slipping, tripping and stumbling without subsequent striking against object, initial encounter: Secondary | ICD-10-CM | POA: Diagnosis not present

## 2019-03-07 DIAGNOSIS — S82851A Displaced trimalleolar fracture of right lower leg, initial encounter for closed fracture: Secondary | ICD-10-CM | POA: Insufficient documentation

## 2019-03-07 DIAGNOSIS — S99911A Unspecified injury of right ankle, initial encounter: Secondary | ICD-10-CM | POA: Diagnosis present

## 2019-03-07 DIAGNOSIS — S82891A Other fracture of right lower leg, initial encounter for closed fracture: Secondary | ICD-10-CM

## 2019-03-07 DIAGNOSIS — Y93E5 Activity, floor mopping and cleaning: Secondary | ICD-10-CM | POA: Insufficient documentation

## 2019-03-07 IMAGING — DX DG ANKLE COMPLETE 3+V*R*
3 series · 3 of 3 positions shown · non-contrast
Comparison: None

CLINICAL DATA: Fall with ankle injury, slipped in the shower 1 hour
ago. Right ankle pain and swelling.

EXAM:
RIGHT ANKLE - COMPLETE 3+ VIEW

[ankle ap]
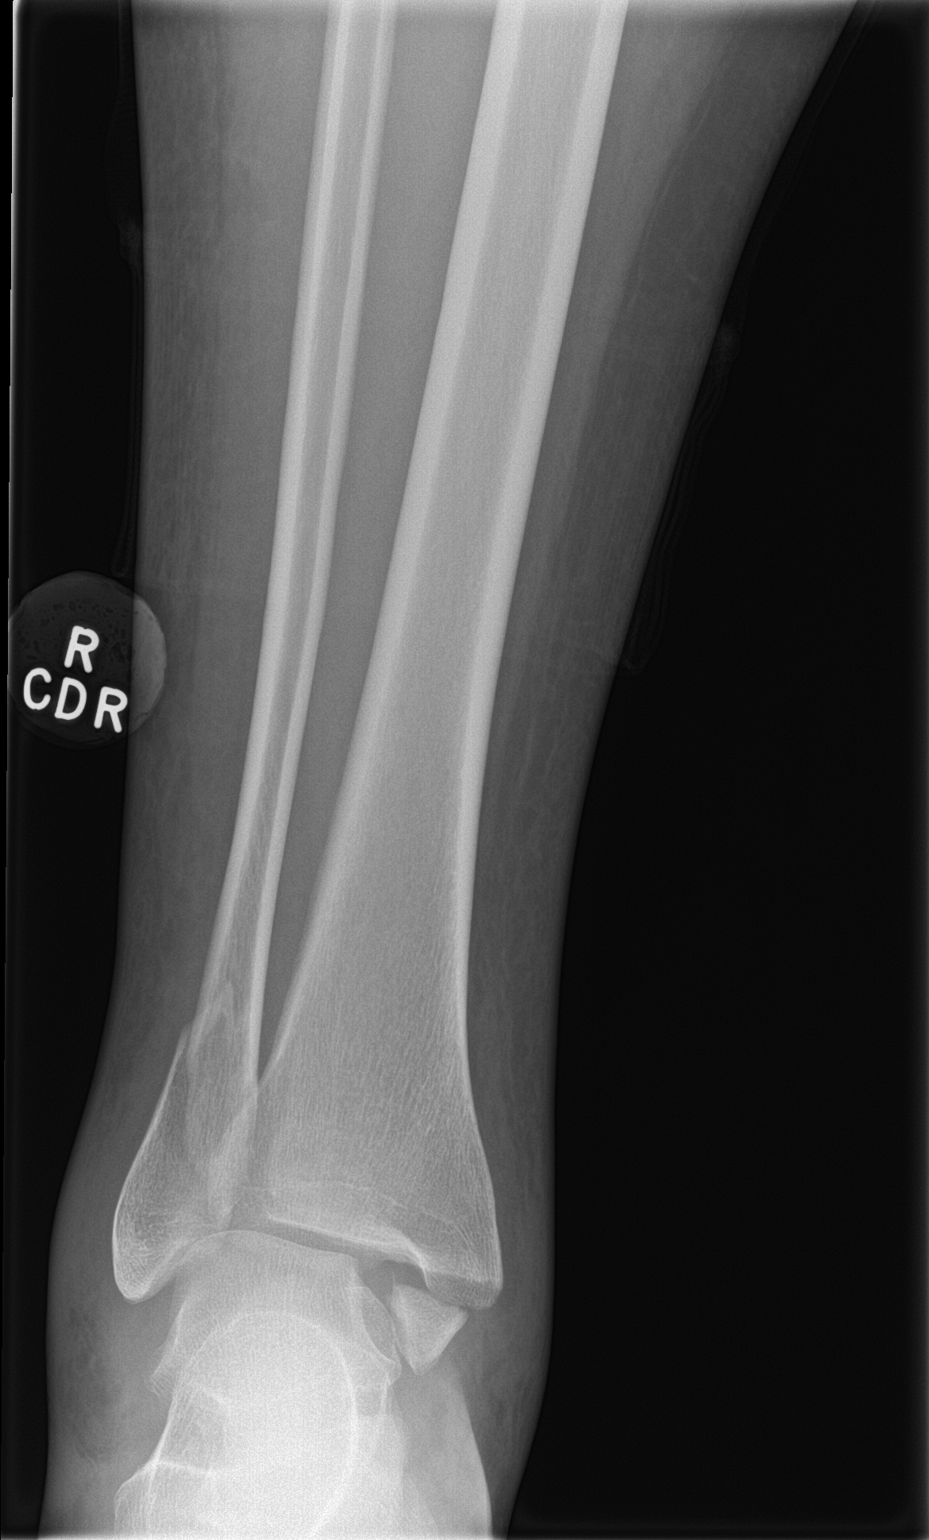

[ankle obl]
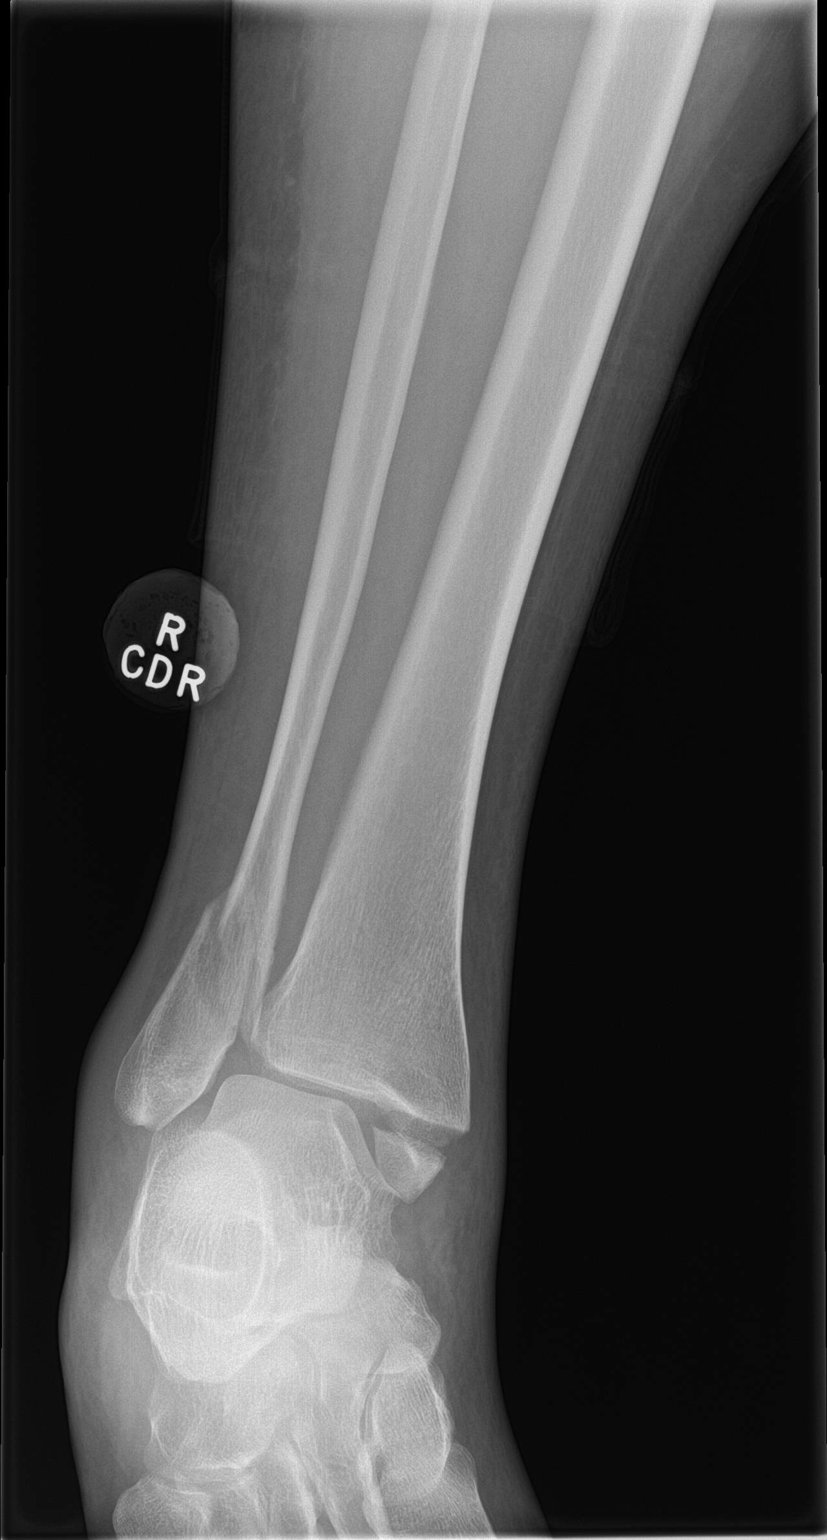

[ankle lat]
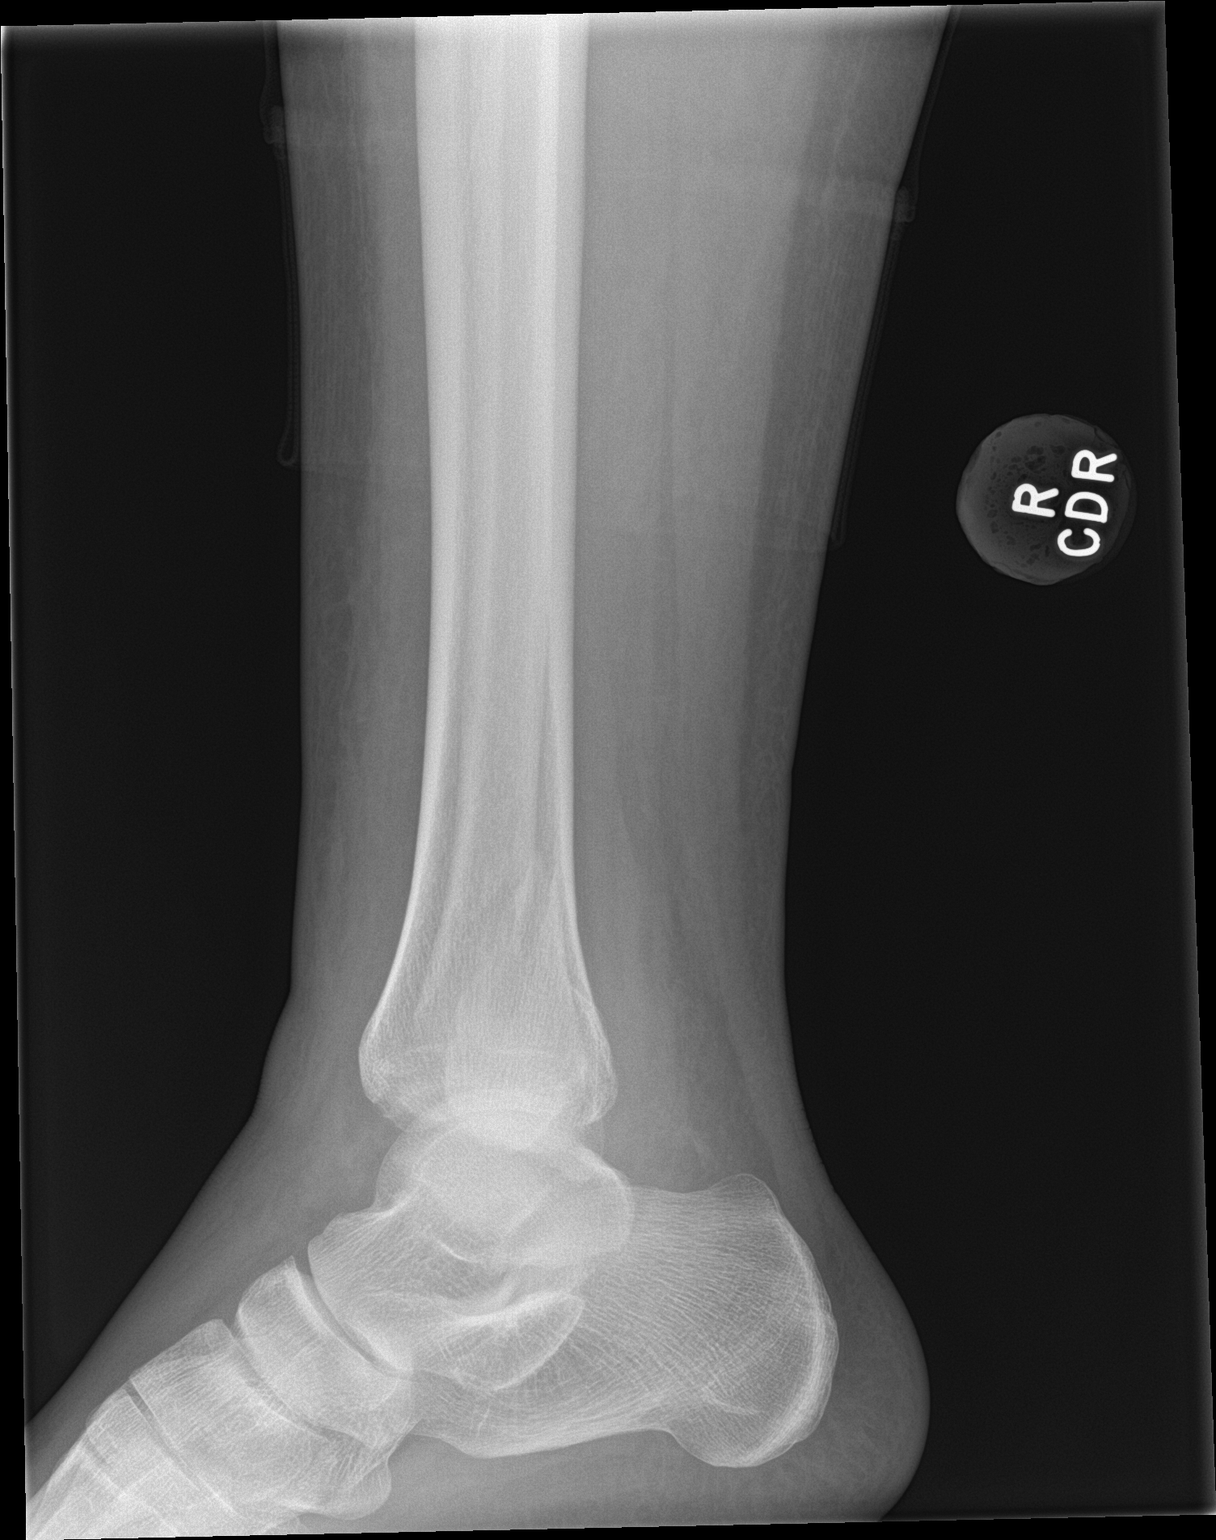

[3 of 3 positions shown; findings below may reference images not displayed]

FINDINGS: Fracture with subluxation at the ankle with lateral and medial
malleolar fractures and approximately 1 cm lateralw translation of
the talus relative to the tibia. Soft tissue swelling. Lateral view
suggests trimalleolar fracture.

Question of fracture fragment along posterior tibia. Soft tissue
swelling about the ankle.
IMPRESSION: Trimalleolar fracture with subluxation at the ankle. CT may be
helpful for further assessment.

## 2019-03-07 MED ORDER — OXYCODONE-ACETAMINOPHEN 5-325 MG PO TABS
1.0000 | ORAL_TABLET | ORAL | Status: DC | PRN
  Administered 2019-03-07: 1 via ORAL
  Filled 2019-03-07: qty 1

## 2019-03-07 MED ORDER — OXYCODONE-ACETAMINOPHEN 5-325 MG PO TABS: 1.0000 | ORAL_TABLET | Freq: Four times a day (QID) | ORAL | 0 refills | Status: DC | PRN

## 2019-03-07 NOTE — Discharge Instructions (Signed)
You were seen in the emergency department today with a right ankle fracture.  We placed a splint which should stay in place.  Please keep this clean and dry.  While at home elevate the right leg above the level of your heart and apply ice.  Keep all weight off of your right leg.  Use the crutches as needed for assistance.  You may take Tylenol and or Motrin as needed for pain and add Percocet as needed for more severe pain.  Do not take this at the same time as your Ambien or Xanax.  Please call the orthopedic doctor listed tomorrow.  Tell the secretary that she had an ankle fracture and need to see Dr. Doran Durand either tomorrow or Wednesday.  Return to the emergency department any new or suddenly worsening symptoms.

## 2019-03-07 NOTE — ED Provider Notes (Signed)
Emergency Department Provider Note   I have reviewed the triage vital signs and the nursing notes.   HISTORY  Chief Complaint Ankle Injury   HPI Alicia Russell is a 44 y.o. female with PMH of anxiety and HTN presents emergency department for evaluation of right ankle pain.  Patient was cleaning the shower when she slipped on a wet spot on the floor and fell.  She denies head injury or loss of consciousness.  She had severe pain in her right ankle and was unable to walk.  She had to crawl out of the room to call for friends/neighbors and then had to scoot down several steps to let them in her front door.  She denies any numbness to the foot.  No pain in the knee or hip on that side.  No back pain.  Pain is severe and worse with movement.  No radiation of symptoms or other modifying factors.  Past Medical History:  Diagnosis Date  . Anxiety   . Hypertension     Patient Active Problem List   Diagnosis Date Noted  . Acute maxillary sinusitis 01/07/2018  . Essential hypertension 11/19/2017  . Fibroids 11/19/2017  . Left ovarian cyst 11/19/2017  . Uterine enlargement 11/19/2017    History reviewed. No pertinent surgical history.  Allergies Patient has no known allergies.  No family history on file.  Social History Social History   Tobacco Use  . Smoking status: Never Smoker  Substance Use Topics  . Alcohol use: No  . Drug use: Not on file    Review of Systems  Constitutional: No fever/chills Gastrointestinal: No abdominal pain.  Musculoskeletal: Positive right ankle pain.  Skin: Negative for rash. Neurological: Negative for headaches  10-point ROS otherwise negative.  ____________________________________________   PHYSICAL EXAM:  VITAL SIGNS: ED Triage Vitals  Enc Vitals Group     BP 03/07/19 1820 128/88     Pulse Rate 03/07/19 1820 95     Resp 03/07/19 1820 16     Temp 03/07/19 1820 98 F (36.7 C)     Temp Source 03/07/19 1820 Oral     SpO2  03/07/19 1820 100 %     Weight 03/07/19 1820 200 lb (90.7 kg)     Height 03/07/19 1820 5\' 6"  (1.676 m)    Constitutional: Alert and oriented. Well appearing and in no acute distress. Eyes: Conjunctivae are normal.  Head: Atraumatic. Nose: No congestion/rhinnorhea. Mouth/Throat: Mucous membranes are moist.   Neck: No stridor.   Cardiovascular: Normal rate, regular rhythm.  Respiratory: Normal respiratory effort.  Gastrointestinal: No distention.  Musculoskeletal: Patient with swelling over the right ankle without proximal fibular tenderness or tenderness in the midfoot.  Pulses and sensation are intact in the right lower extremity. Neurologic:  Normal speech and language. No gross focal neurologic deficits are appreciated.  Skin:  Skin is warm, dry and intact. No rash noted. ____________________________________________  RADIOLOGY  DG Ankle Complete Right  Result Date: 03/07/2019 CLINICAL DATA:  Fall with ankle injury, slipped in the shower 1 hour ago. Right ankle pain and swelling. EXAM: RIGHT ANKLE - COMPLETE 3+ VIEW COMPARISON:  None FINDINGS: Fracture with subluxation at the ankle with lateral and medial malleolar fractures and approximately 1 cm lateralw translation of the talus relative to the tibia. Soft tissue swelling. Lateral view suggests trimalleolar fracture. Question of fracture fragment along posterior tibia. Soft tissue swelling about the ankle. IMPRESSION: Trimalleolar fracture with subluxation at the ankle. CT may be helpful for further assessment.  Electronically Signed   By: Zetta Bills M.D.   On: 03/07/2019 19:11    ____________________________________________   PROCEDURES  Procedure(s) performed:   .Splint Application  Date/Time: 03/08/2019 10:44 AM Performed by: Margette Fast, MD Authorized by: Margette Fast, MD   Consent:    Consent obtained:  Verbal   Consent given by:  Patient   Risks discussed:  Discoloration, numbness, pain and  swelling Pre-procedure details:    Sensation:  Normal Procedure details:    Laterality:  Right   Location:  Ankle   Ankle:  R ankle   Strapping: no     Cast type:  Short leg   Splint type:  Ankle stirrup and short leg   Supplies:  Ortho-Glass Post-procedure details:    Pain:  Improved   Sensation:  Normal   Skin color:  Normal    Patient tolerance of procedure:  Tolerated well, no immediate complications Comments:     Ankle molded during curing for shifting ankle laterally. Patient tolerated well.      ____________________________________________   INITIAL IMPRESSION / ASSESSMENT AND PLAN / ED COURSE  Pertinent labs & imaging results that were available during my care of the patient were reviewed by me and considered in my medical decision making (see chart for details).   Patient presents emergency department evaluation of right ankle pain and swelling.  Suspect underlying fracture clinically.   08:29 PM  Spoke with Dr. Alvan Dame regarding the plain film and radiology read.  No CT at this time.  Recommends splint which was placed.  I molded the splint around the ankle to help shift/hold the ankle mortise which was displaced by 1 cm.  Patient tolerated this well and will follow up by phone with the office tomorrow and request an appointment with Dr. Doran Durand.  Discussed elevation and ice tonight along with nonweightbearing status.  Crutches provided.  Reviewed [facility] drug database prior to [facility]. Discussed ED return precautions.  ____________________________________________  FINAL CLINICAL IMPRESSION(S) / ED DIAGNOSES  Final diagnoses:  Closed fracture of right ankle, initial encounter    NEW OUTPATIENT MEDICATIONS STARTED DURING THIS VISIT:  Discharge Medication List as of 03/07/2019  8:39 PM    START taking these medications   Details  oxyCODONE-acetaminophen (PERCOCET/ROXICET) 5-325 MG tablet Take 1 tablet by mouth every 6 (six) hours as needed for severe pain., Starting  Sun 03/07/2019, Normal        Note:  This document was prepared using Dragon voice recognition software and may include unintentional dictation errors.  Nanda Quinton, MD, [facility] Emergency Medicine    Genevie Elman, [facility], MD 03/08/19 [phone]

## 2019-03-07 NOTE — ED Triage Notes (Signed)
Pt was cleaning the shower when she slipped and injured her R ankle. Ankle is obviously swollen.

## 2019-03-07 NOTE — ED Notes (Signed)
ED Provider at bedside. 

## 2019-03-08 ENCOUNTER — Other Ambulatory Visit: Payer: Self-pay | Admitting: Orthopedic Surgery

## 2019-03-08 ENCOUNTER — Ambulatory Visit
Admission: RE | Admit: 2019-03-08 | Discharge: 2019-03-08 | Disposition: A | Discharge status: Home / Self Care | Payer: 59 | Source: Ambulatory Visit | Attending: Orthopedic Surgery | Admitting: Orthopedic Surgery

## 2019-03-08 DIAGNOSIS — S82851A Displaced trimalleolar fracture of right lower leg, initial encounter for closed fracture: Secondary | ICD-10-CM

## 2019-03-08 IMAGING — CT CT ANKLE*R* W/O CM
3 series · 15 of 36 positions shown, 17 images · non-contrast
Comparison: Radiographs [DATE]

CLINICAL DATA: Evaluate complex ankle fractures.

EXAM:
CT OF THE RIGHT ANKLE WITHOUT CONTRAST
TECHNIQUE: Multidetector CT imaging of the right ankle was performed according
to the standard protocol. Multiplanar CT image reconstructions were
also generated.

[Series 4: soft tissue lower extremity · axial · 0.34mm/px · z∈[-220,-70]mm · 6 of 98 slices shown, 8 images]
[im 15/98  soft-tissue]
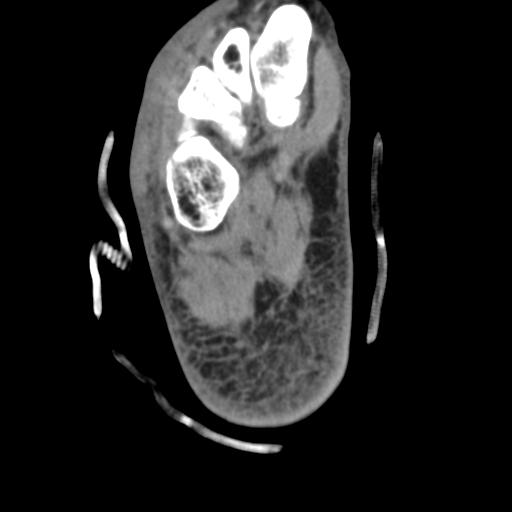
[im 15/98  bone]
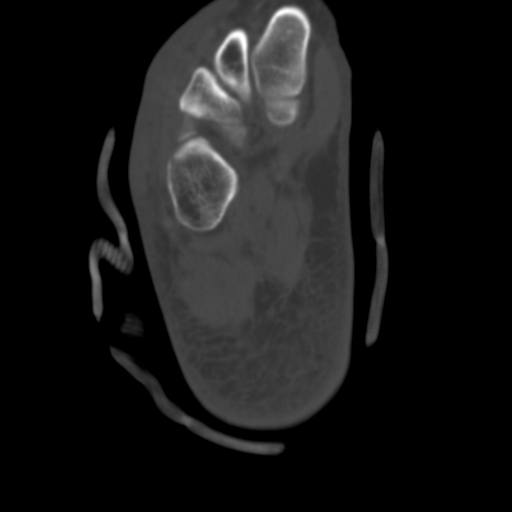
[im 30/98  bone]
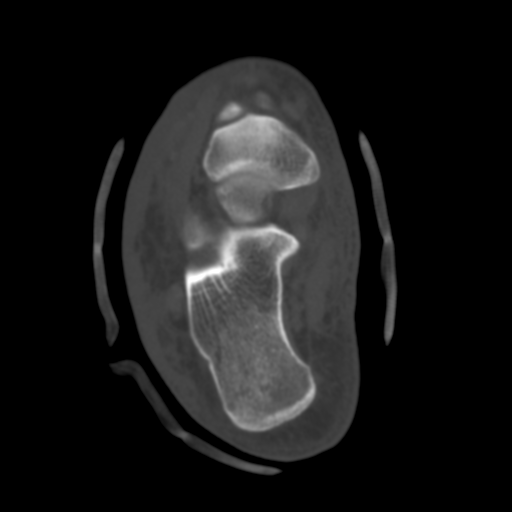
[im 45/98  bone]
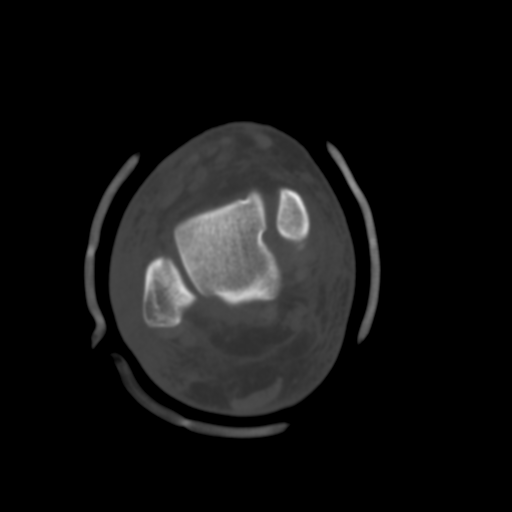
[im 60/98  bone]
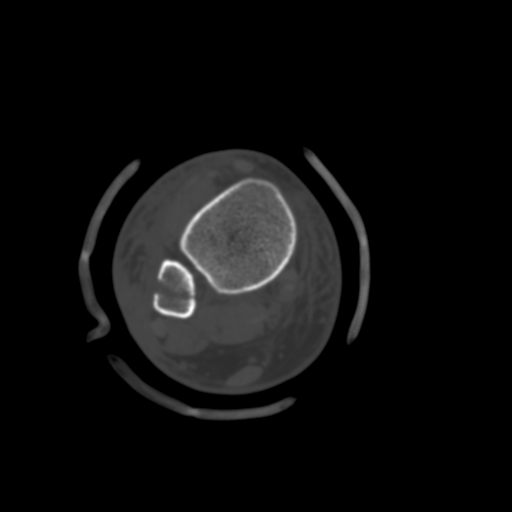
[im 75/98  soft-tissue]
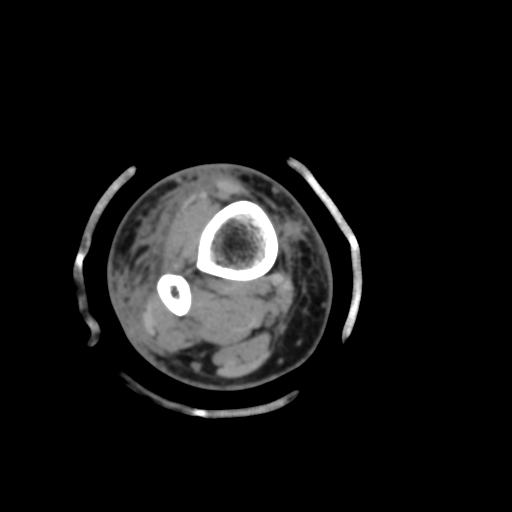
[im 75/98  bone]
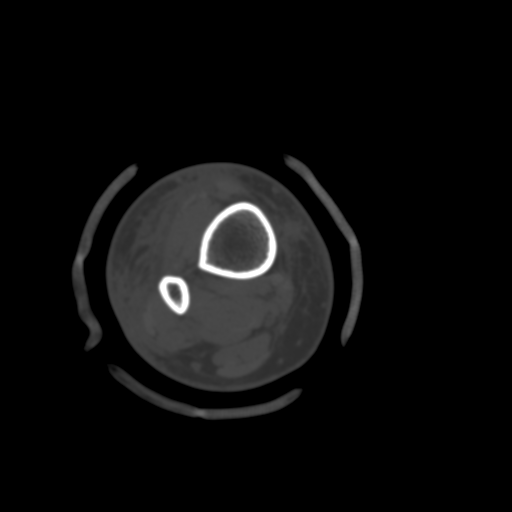
[im 90/98  bone]
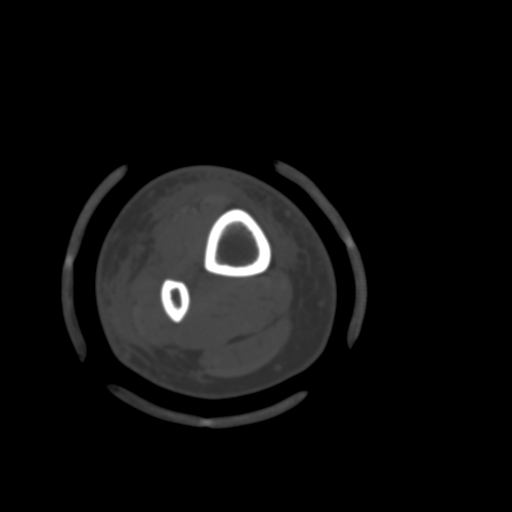

[Series 9: cor soft tissue · coronal · 0.28mm/px · 3 of 76 slices shown]
[im 16/76  bone]
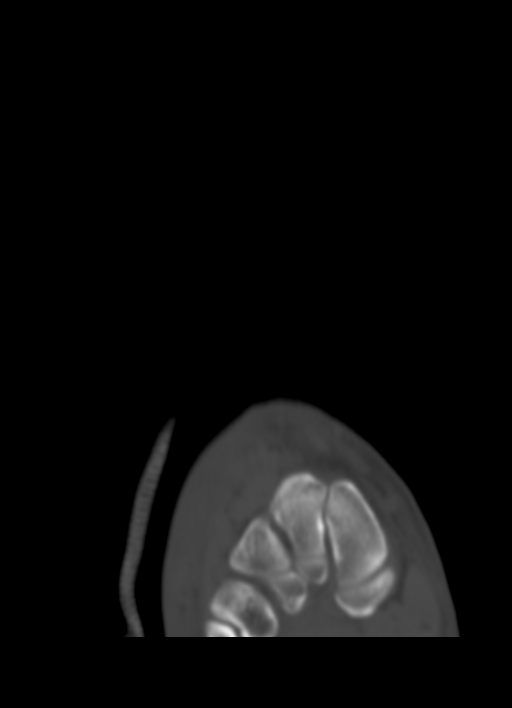
[im 31/76  bone]
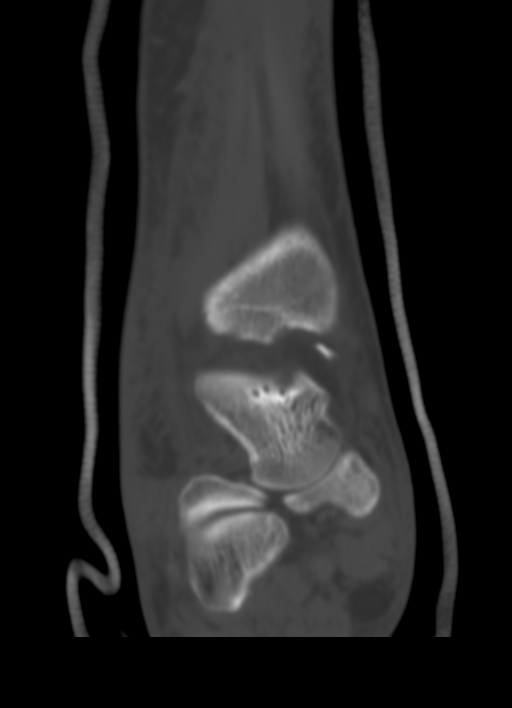
[im 46/76  bone]
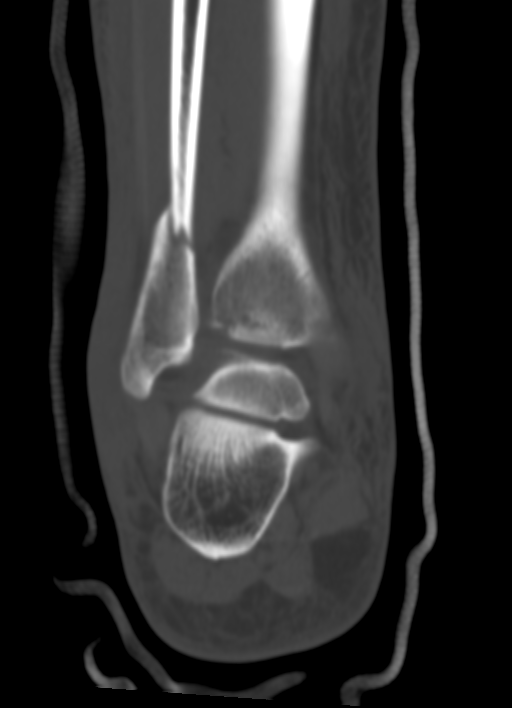

[Series 10: sagsoft tissue · sagittal · 0.30mm/px · 6 of 76 slices shown]
[im 26/76  bone]
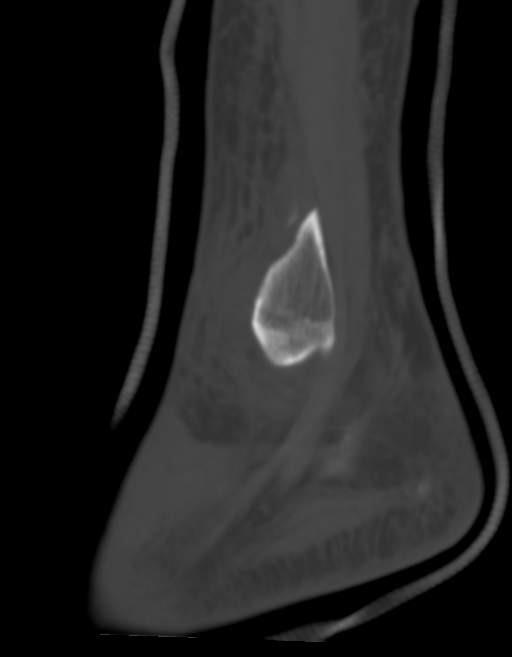
[im 32/76  bone]
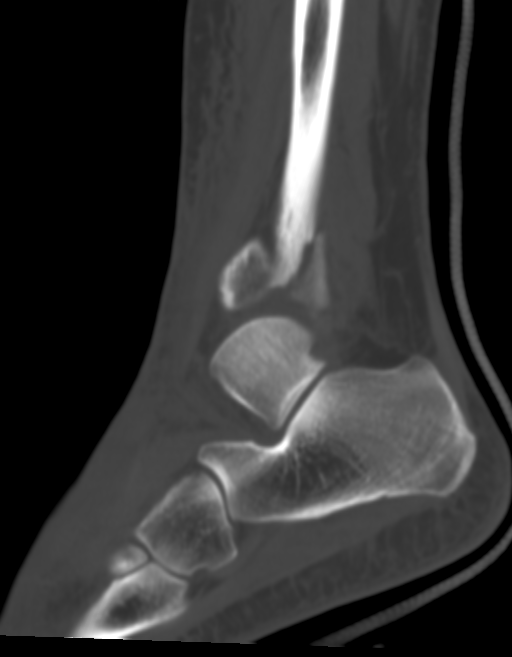
[im 38/76  bone]
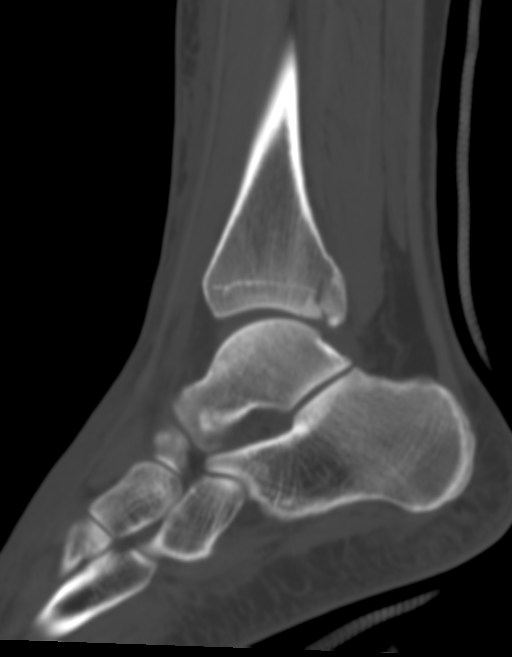
[im 44/76  bone]
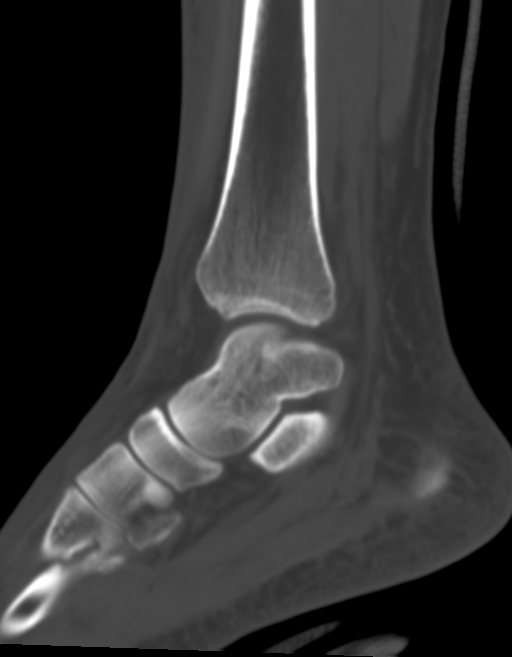
[im 51/76  bone]
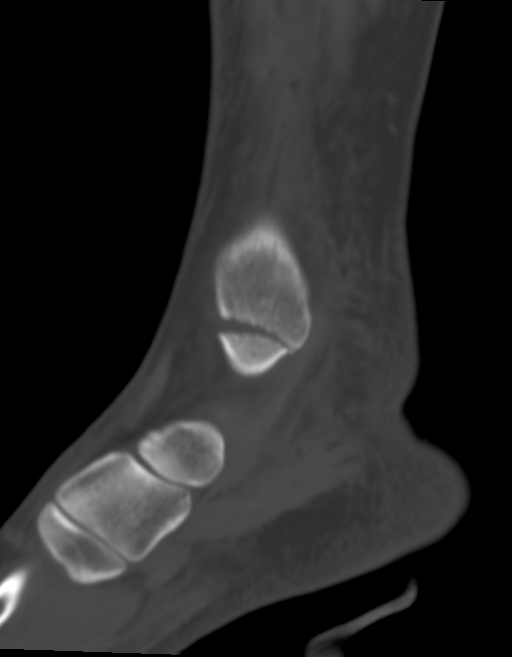
[im 52/76  soft-tissue]
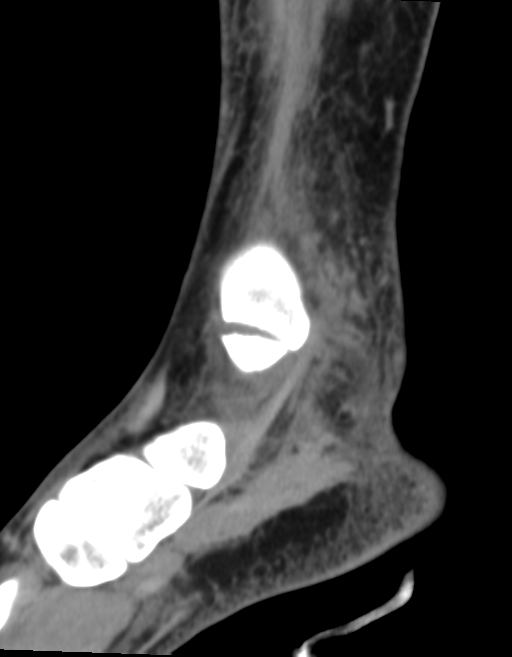

[15 of 36 positions shown; findings below may reference images not displayed]

FINDINGS: There is a relatively nondisplaced transverse fracture through the
base of the medial malleolus. There is also a E nondisplaced
intra-articular fracture involving the posterior malleolus.

Oblique coursing minimally displaced fracture through the distal
fibula at and above the level of the ankle mortise.

Mild medial mortise widening is noted. There is also very slight
anterior widening of the tibiotalar joint. No dislocation.

The talus is intact. The subtalar joints are normal. No mid or
hindfoot fractures are identified.

Moderate subcutaneous soft tissue swelling/edema/hematoma. The major
ankle tendons appear to be grossly intact by CT.
IMPRESSION: 1. Relatively nondisplaced trimalleolar fractures as discussed
above.
2. Mild medial mortise and anterior joint space widening.
3. No mid or hindfoot fractures are identified.
4. Moderate subcutaneous soft tissue swelling/edema/hematoma.
5. The major ankle tendons appear to be grossly intact by CT.

## 2019-05-22 ENCOUNTER — Ambulatory Visit: Payer: 59

## 2019-09-12 ENCOUNTER — Emergency Department (HOSPITAL_COMMUNITY): Payer: 59

## 2019-09-12 ENCOUNTER — Emergency Department (HOSPITAL_COMMUNITY)
Admission: EM | Admit: 2019-09-12 | Discharge: 2019-09-12 | Disposition: A | Discharge status: Home / Self Care | Payer: 59 | Attending: Emergency Medicine | Admitting: Emergency Medicine

## 2019-09-12 ENCOUNTER — Other Ambulatory Visit: Payer: Self-pay

## 2019-09-12 DIAGNOSIS — D259 Leiomyoma of uterus, unspecified: Secondary | ICD-10-CM | POA: Insufficient documentation

## 2019-09-12 DIAGNOSIS — R339 Retention of urine, unspecified: Secondary | ICD-10-CM | POA: Diagnosis present

## 2019-09-12 DIAGNOSIS — I10 Essential (primary) hypertension: Secondary | ICD-10-CM | POA: Diagnosis not present

## 2019-09-12 DIAGNOSIS — D219 Benign neoplasm of connective and other soft tissue, unspecified: Secondary | ICD-10-CM

## 2019-09-12 DIAGNOSIS — K8020 Calculus of gallbladder without cholecystitis without obstruction: Secondary | ICD-10-CM | POA: Diagnosis not present

## 2019-09-12 DIAGNOSIS — Z79899 Other long term (current) drug therapy: Secondary | ICD-10-CM | POA: Insufficient documentation

## 2019-09-12 LAB — CBC WITH DIFFERENTIAL/PLATELET
Abs Immature Granulocytes: 0.03 10*3/uL (ref 0.00–0.07)
Basophils Absolute: 0.1 10*3/uL (ref 0.0–0.1)
Basophils Relative: 1 %
Eosinophils Absolute: 0.1 10*3/uL (ref 0.0–0.5)
Eosinophils Relative: 1 %
HCT: 41.3 % (ref 36.0–46.0)
Hemoglobin: 13 g/dL (ref 12.0–15.0)
Immature Granulocytes: 0 %
Lymphocytes Relative: 15 %
Lymphs Abs: 1.6 10*3/uL (ref 0.7–4.0)
MCH: 26.7 pg (ref 26.0–34.0)
MCHC: 31.5 g/dL (ref 30.0–36.0)
MCV: 85 fL (ref 80.0–100.0)
Monocytes Absolute: 0.5 10*3/uL (ref 0.1–1.0)
Monocytes Relative: 5 %
Neutro Abs: 8 10*3/uL — ABNORMAL HIGH (ref 1.7–7.7)
Neutrophils Relative %: 78 %
Platelets: 401 10*3/uL — ABNORMAL HIGH (ref 150–400)
RBC: 4.86 MIL/uL (ref 3.87–5.11)
RDW: 12.7 % (ref 11.5–15.5)
WBC: 10.1 10*3/uL (ref 4.0–10.5)
nRBC: 0 % (ref 0.0–0.2)

## 2019-09-12 LAB — BASIC METABOLIC PANEL
Anion gap: 15 (ref 5–15)
BUN: 5 mg/dL — ABNORMAL LOW (ref 6–20)
CO2: 19 mmol/L — ABNORMAL LOW (ref 22–32)
Calcium: 9.3 mg/dL (ref 8.9–10.3)
Chloride: 102 mmol/L (ref 98–111)
Creatinine, Ser: 0.8 mg/dL (ref 0.44–1.00)
GFR calc Af Amer: >60 mL/min (ref >60)
GFR calc non Af Amer: >60 mL/min (ref >60)
Glucose, Bld: 92 mg/dL (ref 70–99)
Potassium: 2.7 mmol/L — CL (ref 3.5–5.1)
Sodium: 136 mmol/L (ref 135–145)

## 2019-09-12 LAB — POC URINE PREG, ED: Preg Test, Ur: NEGATIVE

## 2019-09-12 LAB — URINALYSIS, ROUTINE W REFLEX MICROSCOPIC
Bilirubin Urine: NEGATIVE
Glucose, UA: NEGATIVE mg/dL
Ketones, ur: NEGATIVE mg/dL
Leukocytes,Ua: NEGATIVE
Nitrite: NEGATIVE
Protein, ur: NEGATIVE mg/dL
Specific Gravity, Urine: 1.004 — ABNORMAL LOW (ref 1.005–1.030)
pH: 7 (ref 5.0–8.0)

## 2019-09-12 LAB — POTASSIUM: Potassium: 3.6 mmol/L (ref 3.5–5.1)

## 2019-09-12 IMAGING — CT CT ABD-PELV W/ CM
2 of 5 series · 15 of 46 positions shown, 17 images · IV contrast (APPLIED)
Comparison: [DATE] abdominal ultrasound. Prior CT of [DATE]
from [HOSPITAL].

CLINICAL DATA: Abdominal distension.  Dysuria.

EXAM:
CT ABDOMEN AND PELVIS WITH CONTRAST
TECHNIQUE: Multidetector CT imaging of the abdomen and pelvis was performed
using the standard protocol following bolus administration of
intravenous contrast.
CONTRAST:  100mL OMNIPAQUE IOHEXOL 300 MG/ML  SOLN

[Series 3: abdomen 5.0 · axial · 0.81mm/px · z∈[-430,-15]mm · 12 of 99 slices shown, 14 images]
[im 8/99  soft-tissue]
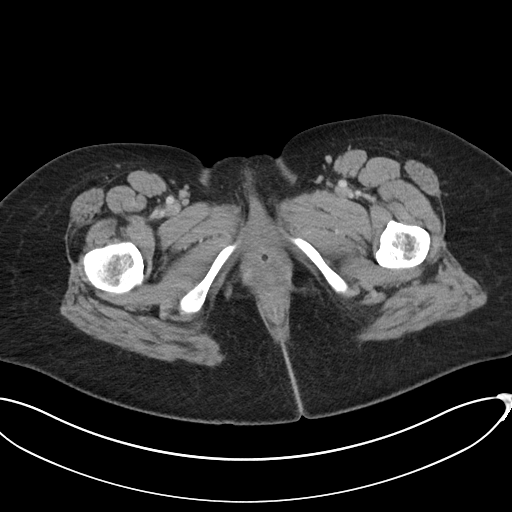
[im 8/99  bone]
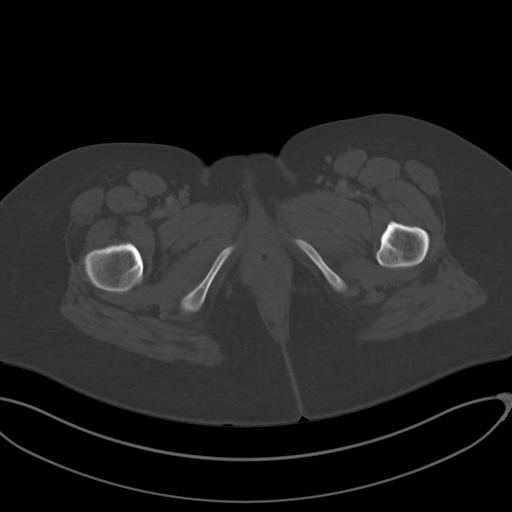
[im 16/99  soft-tissue]
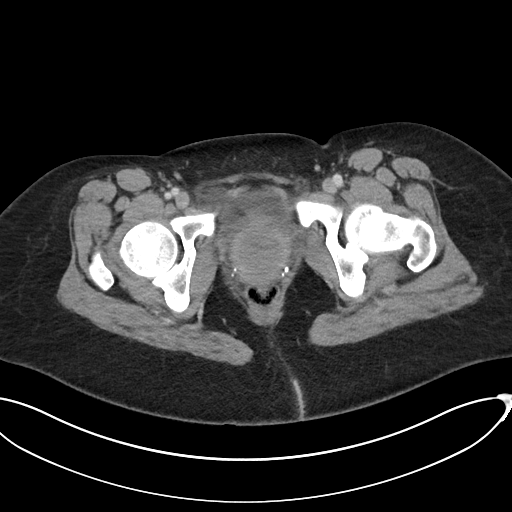
[im 23/99  soft-tissue]
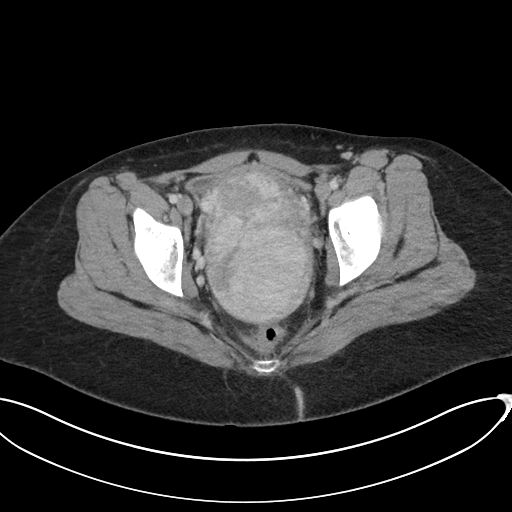
[im 31/99  soft-tissue]
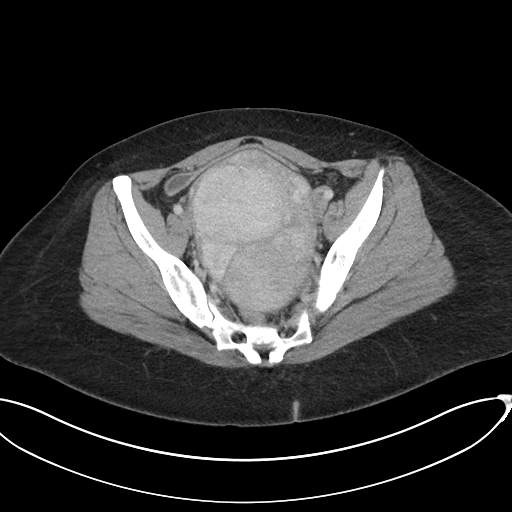
[im 38/99  soft-tissue]
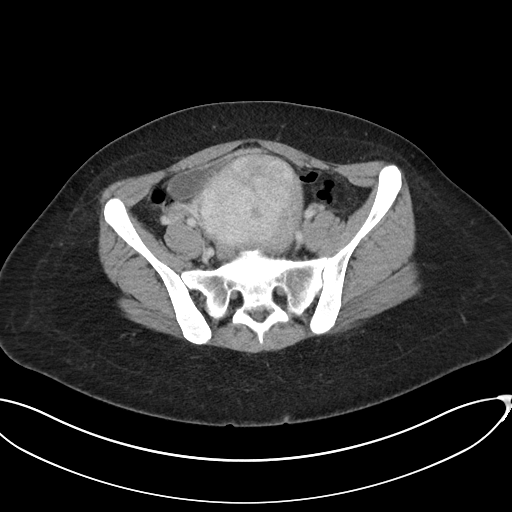
[im 46/99  soft-tissue]
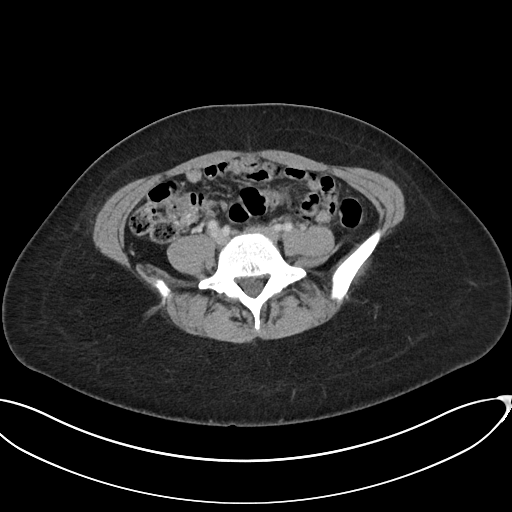
[im 53/99  soft-tissue]
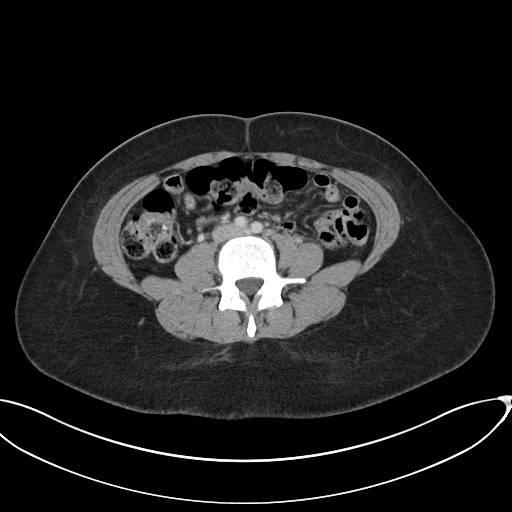
[im 61/99  soft-tissue]
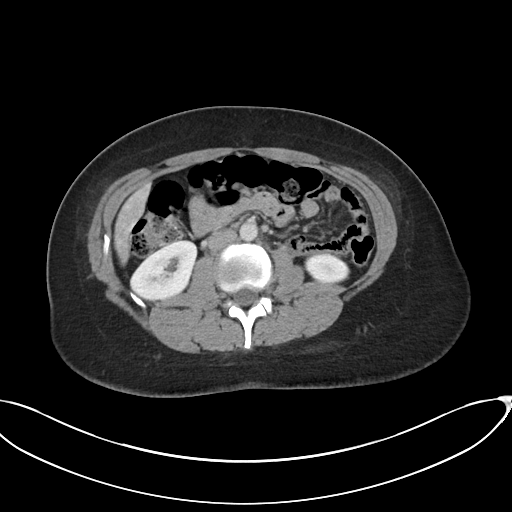
[im 68/99  soft-tissue]
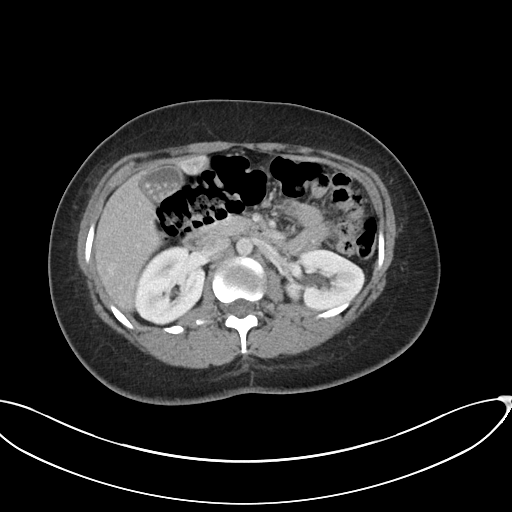
[im 68/99  bone]
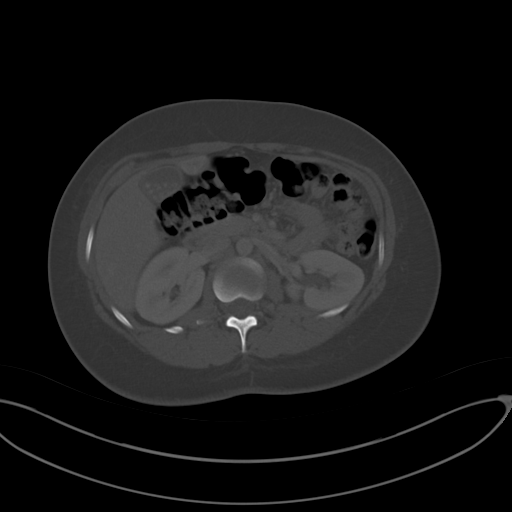
[im 76/99  soft-tissue]
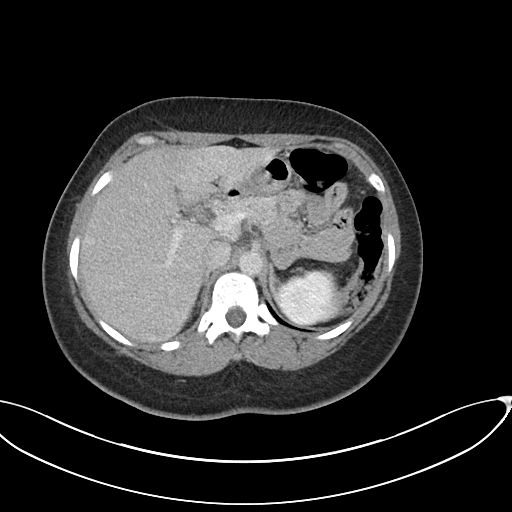
[im 83/99  soft-tissue]
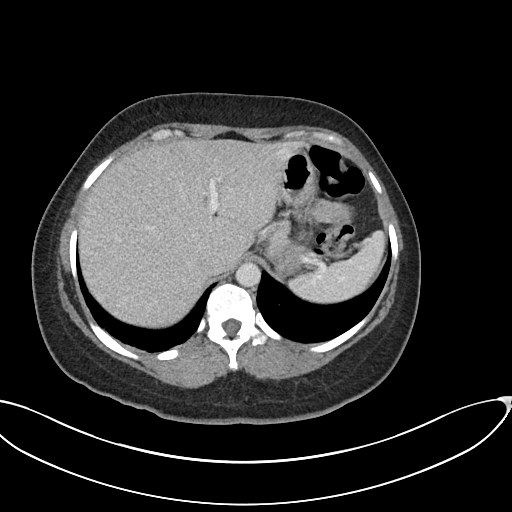
[im 91/99  soft-tissue]
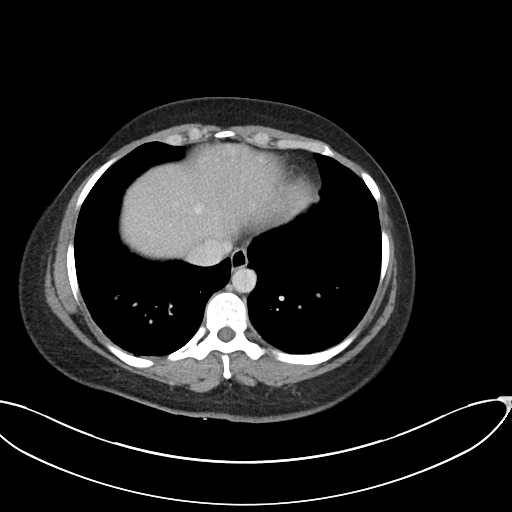

[Series 6: abdomen 3.0 mpr cor · coronal · 0.70mm/px · 3 of 93 slices shown]
[im 31/93  soft-tissue]
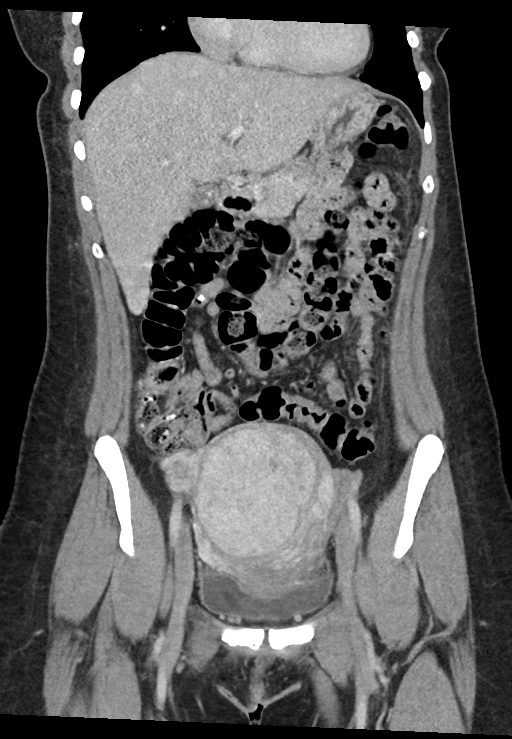
[im 41/93  soft-tissue]
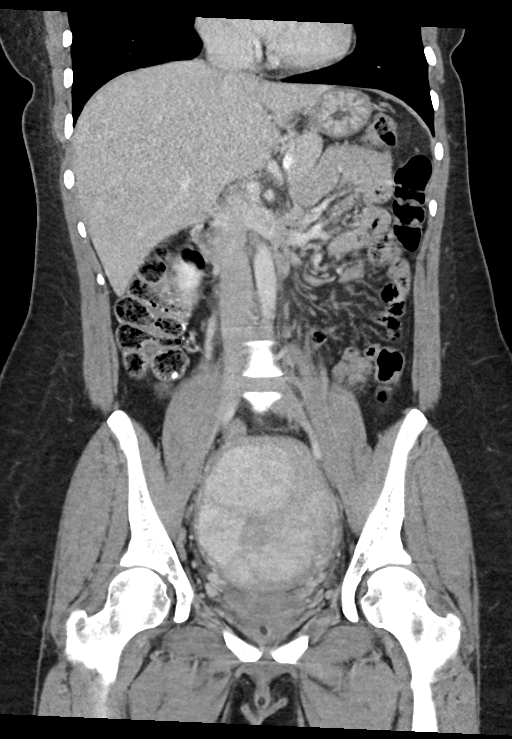
[im 52/93  soft-tissue]
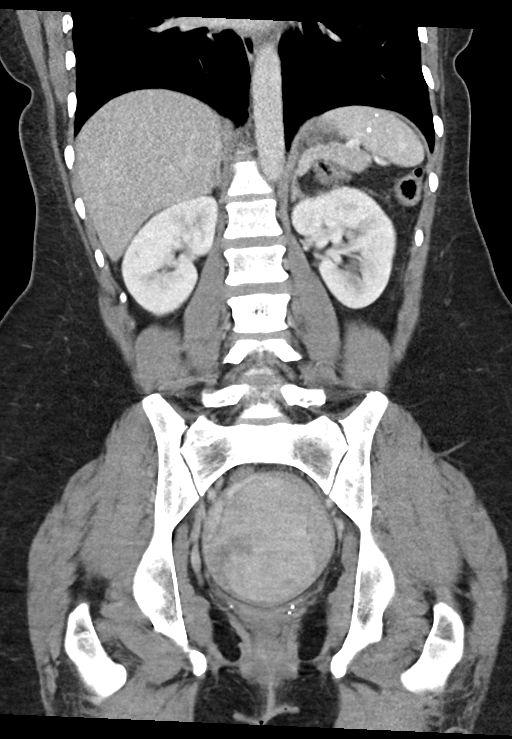

[15 of 46 positions shown; findings below may reference images not displayed]

FINDINGS: Lower chest: Clear lung bases. Normal heart size without pericardial
or pleural effusion.

Hepatobiliary: Innumerable foci of early portal venous phase
hyperenhancement throughout the liver. Examples within the high left
hepatic lobe 1.5 cm on [DATE] and within the right hepatic lobe at
1.3 cm on [DATE]. Multiple gallstones. No acute cholecystitis or
biliary duct dilatation.

Pancreas: Normal, without mass or ductal dilatation.

Spleen: Old granulomatous disease in the spleen.

Adrenals/Urinary Tract: Normal adrenal glands. Normal kidneys,
without hydronephrosis. The bladder is displaced anteriorly, with
mass effect upon by the enlarged uterus. A Foley catheter is within
the bladder.

Stomach/Bowel: Normal stomach, without wall thickening. Normal
colon, appendix, and terminal ileum. Normal small bowel.

Vascular/Lymphatic: Normal caliber of the aorta and branch vessels.
No abdominopelvic adenopathy.

Reproductive: Markedly enlarged uterus with multiple soft tissue
masses within. Enlargement is grossly similar to the noncontrast CT
of [DATE]. Example dominant mass within the uterine fundus at
6.9 cm on 69/3. Within the lower uterine segment at 8.0 cm on 76/3.
Adnexa not well evaluated secondary to mass effect by the uterus.

Other: No significant free fluid.

Musculoskeletal: No acute osseous abnormality.  Disc bulge at L5-S1.
IMPRESSION: 1. Markedly enlarged uterus with underlying masses, most consistent
with fibroids. The enlarged uterus compresses the urinary bladder
anteriorly, likely the cause of patient's symptoms. No
hydronephrosis or other complication.
2. Cholelithiasis.
3. Innumerable hyperenhancing lesions throughout the liver on early
portal venous phase imaging. Suspect multiple areas of focal nodular
hyperplasia or multiple adenomas (especially if the patient is on
birth control pills). Consider dedicated nonemergent pre and post
contrast abdominal MRI, ideally with Eovist.

## 2019-09-12 MED ORDER — DIAZEPAM 5 MG/ML IJ SOLN
2.5000 mg | Freq: Once | INTRAMUSCULAR | Status: DC
  Filled 2019-09-12: qty 2

## 2019-09-12 MED ORDER — OXYBUTYNIN CHLORIDE 5 MG PO TABS
5.0000 mg | ORAL_TABLET | Freq: Once | ORAL | Status: AC
  Administered 2019-09-12: 5 mg via ORAL
  Filled 2019-09-12: qty 1

## 2019-09-12 MED ORDER — DIAZEPAM 5 MG/ML IJ SOLN
2.5000 mg | Freq: Once | INTRAMUSCULAR | Status: AC
  Administered 2019-09-12: 2.5 mg via INTRAVENOUS
  Filled 2019-09-12: qty 2

## 2019-09-12 MED ORDER — SODIUM CHLORIDE 0.9 % IV SOLN
1000.0000 mL | INTRAVENOUS | Status: DC
  Administered 2019-09-12: 1000 mL via INTRAVENOUS

## 2019-09-12 MED ORDER — SODIUM CHLORIDE 0.9 % IV BOLUS (SEPSIS)
250.0000 mL | Freq: Once | INTRAVENOUS | Status: AC
  Administered 2019-09-12: 250 mL via INTRAVENOUS

## 2019-09-12 MED ORDER — POTASSIUM CHLORIDE 10 MEQ/100ML IV SOLN
10.0000 meq | Freq: Once | INTRAVENOUS | Status: AC
  Administered 2019-09-12: 10 meq via INTRAVENOUS
  Filled 2019-09-12: qty 100

## 2019-09-12 MED ORDER — IOHEXOL 300 MG/ML  SOLN
100.0000 mL | Freq: Once | INTRAMUSCULAR | Status: AC | PRN
  Administered 2019-09-12: 100 mL via INTRAVENOUS

## 2019-09-12 MED ORDER — OXYBUTYNIN CHLORIDE 5 MG PO TABS: 5.0000 mg | ORAL_TABLET | Freq: Three times a day (TID) | ORAL | 0 refills | Status: DC | PRN

## 2019-09-12 MED ORDER — POTASSIUM CHLORIDE CRYS ER 20 MEQ PO TBCR
40.0000 meq | EXTENDED_RELEASE_TABLET | Freq: Once | ORAL | Status: AC
  Administered 2019-09-12: 40 meq via ORAL
  Filled 2019-09-12: qty 2

## 2019-09-12 MED ORDER — HYDROMORPHONE HCL 1 MG/ML IJ SOLN
1.0000 mg | Freq: Once | INTRAMUSCULAR | Status: AC
  Administered 2019-09-12: 1 mg via INTRAVENOUS
  Filled 2019-09-12: qty 1

## 2019-09-12 MED ORDER — HYDROMORPHONE HCL 1 MG/ML IJ SOLN
0.5000 mg | Freq: Once | INTRAMUSCULAR | Status: DC
  Filled 2019-09-12: qty 1

## 2019-09-12 NOTE — ED Notes (Signed)
Patient transported to CT 

## 2019-09-12 NOTE — ED Notes (Signed)
Bladder scan completed: >91mL

## 2019-09-12 NOTE — ED Notes (Signed)
Pt discharge instructions reviewed with the patient. The patient verbalized understanding instructions. Pt discharged.

## 2019-09-12 NOTE — ED Triage Notes (Signed)
Pt is seeing a n urologist for her not being able to urinate fully. Pt said tonight she feels like she has to go and then can't. Pt  said her bottom is sore for trying to strain. Pt said she was told by urologist that she does not empty her bladder all the way. Pt feels the urge but only a small amount will come out

## 2019-09-12 NOTE — ED Provider Notes (Signed)
[facility] EMERGENCY DEPARTMENT Provider Note   CSN: [id number] Arrival date & time: 09/12/19  0548     History Chief Complaint  Patient presents with  . Urinary Retention    Alicia Russell is a 44 y.o. female.  HPI Patient has intermittently been having difficulty with emptying her bladder since December.  She has been seen by urology and diagnosed with incomplete emptying of her bladder but apparent cause unclear.  She has been straining to urinate since 1 AM in the morning.  She reports that she is having severe abdominal pain and also pain into her lower back.  She has only been able to pass extremely small amounts of urine.  She has been trying to put manual pressure on her bladder with no relief.   Patient reports she has had urodynamic studies done.  She has seen her gynecologist.  She reports she does have some fibroids but her gynecologist did not think they were interfering with bladder emptying.    Past Medical History:  Diagnosis Date  . Anxiety   . Hypertension     Patient Active Problem List   Diagnosis Date Noted  . Acute maxillary sinusitis 01/07/2018  . Essential hypertension 11/19/2017  . Fibroids 11/19/2017  . Left ovarian cyst 11/19/2017  . Uterine enlargement 11/19/2017    No past surgical history on file.   OB History   No obstetric history on file.     No family history on file.  Social History   Tobacco Use  . Smoking status: Never Smoker  Substance Use Topics  . Alcohol use: No  . Drug use: Not on file    Home Medications Prior to Admission medications   Medication Sig Start Date End Date Taking? Authorizing Provider  acetaminophen (TYLENOL) 325 MG tablet Take 325 mg by mouth every 6 (six) hours as needed for mild pain.   Yes [provider]  ALPRAZolam Duanne Moron) 1 MG tablet Take 0.5 mg by mouth daily as needed for anxiety.  12/21/15  Yes [provider]  amLODipine (NORVASC) 10 MG tablet Take 10 mg  by mouth daily.  12/19/15  Yes [provider]  BIOTIN PO Take 1 tablet by mouth daily.   Yes [provider]  Cholecalciferol (VITAMIN D-3) 125 MCG (5000 UT) TABS Take 1 tablet by mouth daily.   Yes [provider]  Multiple Vitamin (MULTIVITAMIN ADULT PO) Take 1 tablet by mouth daily.   Yes [provider]  Norethindrone Acet-Ethinyl Est (JUNEL 1/20 PO) Junel 1/20 (21) 1 mg-20 mcg tablet   Yes [provider]  tamsulosin (FLOMAX) 0.4 MG CAPS capsule Take 0.4 mg by mouth daily. 08/04/19  Yes [provider]  traZODone (DESYREL) 50 MG tablet Take 50 mg by mouth daily as needed for sleep.  01/03/16  Yes [provider]  Venlafaxine HCl 150 MG TB24 Take 150 mg by mouth daily. 06/23/18  Yes [provider]  venlafaxine XR (EFFEXOR-XR) 75 MG 24 hr capsule Take 75 mg by mouth daily.   Yes [provider]  zolpidem (AMBIEN) 10 MG tablet Take 10 mg by mouth at bedtime.  12/21/15  Yes [provider]  ALPRAZolam Duanne Moron) 0.5 MG tablet Take 1 tablet (0.5 mg total) by mouth 3 (three) times daily as needed for anxiety. Patient not taking: Reported on 09/12/2019 06/16/15   Charlann Lange, PA-C  amoxicillin-clavulanate (AUGMENTIN) 875-125 MG tablet Take 1 tablet by mouth every 12 (twelve) hours. Patient not taking:  Reported on 09/12/2019 01/07/18   Vanessa Kick, MD  oxybutynin (DITROPAN) 5 MG tablet Take 1 tablet (5 mg total) by mouth every 8 (eight) hours as needed for bladder spasms. 09/12/19   Charlesetta Shanks, MD  oxyCODONE-acetaminophen (PERCOCET/ROXICET) 5-325 MG tablet Take 1 tablet by mouth every 6 (six) hours as needed for severe pain. Patient not taking: Reported on 09/12/2019 03/07/19   Long, Wonda Old[employer], MD    Allergies    Patient has no known allergies.  Review of Systems   Review of Systems 10 systems reviewed and negative except as per HPI Physical Exam Updated Vital Signs BP 110/60 (BP Location: Right Arm)   Pulse  77   Temp 98.3 F (36.8 C) (Oral)   Resp 20   Ht 5\' 6"  (1.676 m)   Wt 86.2 kg   SpO2 100%   BMI 30.67 kg/m   Physical Exam Constitutional:      Comments: Patient is alert.  Nontoxic.  No respiratory distress.  She is in severe pain.  HENT:     Head: Normocephalic and atraumatic.  Cardiovascular:     Comments: Tachycardia.  No gross rub murmur gallop. Pulmonary:     Effort: Pulmonary effort is normal.     Breath sounds: Normal breath sounds.  Abdominal:     Comments: Lower abdomen is full and distended.  Tender.  Positive CVA tenderness.  Musculoskeletal:        General: No swelling or tenderness. Normal range of motion.     Comments: Patient has well-healed surgical scar on right ankle.  Mild swelling relative to the left.  Calf soft and nontender without edema.  Skin:    General: Skin is warm and dry.  Neurological:     General: No focal deficit present.     Mental Status: She is oriented to person, place, and time.     Coordination: Coordination normal.     ED Results / Procedures / Treatments   Labs (all labs ordered are listed, but only abnormal results are displayed) Labs Reviewed  URINALYSIS, ROUTINE W REFLEX MICROSCOPIC - Abnormal; Notable for the following components:      Result Value   Color, Urine STRAW (*)    Specific Gravity, Urine 1.004 (*)    Hgb urine dipstick SMALL (*)    Bacteria, UA RARE (*)    All other components within normal limits  CBC WITH DIFFERENTIAL/PLATELET - Abnormal; Notable for the following components:   Platelets 401 (*)    Neutro Abs 8.0 (*)    All other components within normal limits  BASIC METABOLIC PANEL - Abnormal; Notable for the following components:   Potassium 2.7 (*)    CO2 19 (*)    BUN 5 (*)    All other components within normal limits  POTASSIUM  POC URINE PREG, ED    EKG None  Radiology CT Abdomen Pelvis W Contrast  Result Date: 09/12/2019 CLINICAL DATA:  Abdominal distension.  Dysuria. EXAM: CT ABDOMEN  AND PELVIS WITH CONTRAST TECHNIQUE: Multidetector CT imaging of the abdomen and pelvis was performed using the standard protocol following bolus administration of intravenous contrast. CONTRAST:  114mL OMNIPAQUE IOHEXOL 300 MG/ML  SOLN COMPARISON:  06/13/2008 abdominal ultrasound. Prior CT of 12/18/2018 from [facility]. FINDINGS: Lower chest: Clear lung bases. Normal heart size without pericardial or pleural effusion. Hepatobiliary: Innumerable foci of early portal venous phase hyperenhancement throughout the liver. Examples within the high left hepatic lobe 1.5 cm on 12/03 and within the right  hepatic lobe at 1.3 cm on 28/3. Multiple gallstones. No acute cholecystitis or biliary duct dilatation. Pancreas: Normal, without mass or ductal dilatation. Spleen: Old granulomatous disease in the spleen. Adrenals/Urinary Tract: Normal adrenal glands. Normal kidneys, without hydronephrosis. The bladder is displaced anteriorly, with mass effect upon by the enlarged uterus. A Foley catheter is within the bladder. Stomach/Bowel: Normal stomach, without wall thickening. Normal colon, appendix, and terminal ileum. Normal small bowel. Vascular/Lymphatic: Normal caliber of the aorta and branch vessels. No abdominopelvic adenopathy. Reproductive: Markedly enlarged uterus with multiple soft tissue masses within. Enlargement is grossly similar to the noncontrast CT of 12/18/2018. Example dominant mass within the uterine fundus at 6.9 cm on 69/3. Within the lower uterine segment at 8.0 cm on 76/3. Adnexa not well evaluated secondary to mass effect by the uterus. Other: No significant free fluid. Musculoskeletal: No acute osseous abnormality.  Disc bulge at L5-S1. IMPRESSION: 1. Markedly enlarged uterus with underlying masses, most consistent with fibroids. The enlarged uterus compresses the urinary bladder anteriorly, likely the cause of patient's symptoms. No hydronephrosis or other complication. 2. Cholelithiasis. 3.  Innumerable hyperenhancing lesions throughout the liver on early portal venous phase imaging. Suspect multiple areas of focal nodular hyperplasia or multiple adenomas (especially if the patient is on birth control pills). Consider dedicated nonemergent pre and post contrast abdominal MRI, ideally with Eovist. Electronically Signed   By: Abigail Miyamoto M.D.   On: 09/12/2019 13:44    Procedures Procedures (including critical care time)  Medications Ordered in ED Medications  diazepam (VALIUM) injection 2.5 mg (0 mg Intravenous Hold 09/12/19 1159)  HYDROmorphone (DILAUDID) injection 0.5 mg (0 mg Intravenous Hold 09/12/19 1200)  sodium chloride 0.9 % bolus 250 mL (0 mLs Intravenous Stopped 09/12/19 1351)    Followed by  0.9 %  sodium chloride infusion (1,000 mLs Intravenous New Bag/Given 09/12/19 1423)  HYDROmorphone (DILAUDID) injection 1 mg (1 mg Intravenous Given 09/12/19 0841)  diazepam (VALIUM) injection 2.5 mg (2.5 mg Intravenous Given 09/12/19 0907)  potassium chloride SA (KLOR-CON) CR tablet 40 mEq (40 mEq Oral Given 09/12/19 1124)  potassium chloride 10 mEq in 100 mL IVPB (0 mEq Intravenous Stopped 09/12/19 1305)  oxybutynin (DITROPAN) tablet 5 mg (5 mg Oral Given 09/12/19 1420)  iohexol (OMNIPAQUE) 300 MG/ML solution 100 mL (100 mLs Intravenous Contrast Given 09/12/19 1316)    ED Course  I have reviewed the triage vital signs and the nursing notes.  Pertinent labs & imaging results that were available during my care of the patient were reviewed by me and considered in my medical decision making (see chart for details).    MDM Rules/Calculators/A&P                         Bladder scan done by med tech shows greater than 999 mL in bladder.  Consult: Reviewed with Dr. Junious Silk.  Recommend starting Ditropan and follow-up this week in the office.  Patient presents with urinary retention.  She had high-volume greater than a liter.  Patient improved over time with decompression of the bladder and  administration of Dilaudid, Valium and Ditropan.  She also had hypokalemia.  This was corrected with oral dose and 1 IV dose of potassium.  Renal function normal.  Incidental note on CT scan of extensive gallstones and adenomatous liver requiring further evaluation by MRI.  She made aware of finding and necessity to follow-up with PCP for this.  She has follow-up with urology this week.  Return  precautions reviewed. Final Clinical Impression(s) / ED Diagnoses Final diagnoses:  Urinary retention  Fibroids  Gallstones    Rx / DC Orders ED Discharge Orders         Ordered    oxybutynin (DITROPAN) 5 MG tablet  Every 8 hours PRN        09/12/19 1522           Charlesetta Shanks, MD 09/12/19 1527

## 2019-09-12 NOTE — ED Notes (Signed)
PATIENT IN ROOM 14

## 2019-09-12 NOTE — Discharge Instructions (Addendum)
1.  Schedule follow-up appointment with your family doctor.  Your family doctor will need to review your CT scan to help you address future care for gallstones and arrange a follow-up MRI for your liver. 2.  See your urologist this week as scheduled.  Your Foley catheter will remain in place until your urologist determines it is time to remove it. 3.  You do have very large fibroids in your uterus.  Discuss with your urologist and your gynecologist if it is causing your urinary retention.

## 2019-09-12 NOTE — ED Notes (Signed)
Pt's catheter site area appears fine.

## 2019-09-21 ENCOUNTER — Other Ambulatory Visit (HOSPITAL_COMMUNITY): Payer: Self-pay | Admitting: Family Medicine

## 2019-09-21 DIAGNOSIS — R935 Abnormal findings on diagnostic imaging of other abdominal regions, including retroperitoneum: Secondary | ICD-10-CM

## 2019-09-29 ENCOUNTER — Other Ambulatory Visit: Payer: Self-pay

## 2019-09-29 ENCOUNTER — Ambulatory Visit (HOSPITAL_COMMUNITY)
Admission: RE | Admit: 2019-09-29 | Discharge: 2019-09-29 | Disposition: A | Discharge status: Home / Self Care | Payer: 59 | Source: Ambulatory Visit | Attending: Family Medicine | Admitting: Family Medicine

## 2019-09-29 DIAGNOSIS — R935 Abnormal findings on diagnostic imaging of other abdominal regions, including retroperitoneum: Secondary | ICD-10-CM

## 2019-09-29 IMAGING — MR MR ABDOMEN WO/W CM
18 series · 46 of 48 positions shown · IV contrast (10.0 mL Eovist)
Comparison: Multiple exams, including CT abdomen [DATE]

CLINICAL DATA: Abdominal distension, liver lesions on recent CT

EXAM:
MRI ABDOMEN WITHOUT AND WITH CONTRAST
TECHNIQUE: Multiplanar multisequence MR imaging of the abdomen was performed
both before and after the administration of intravenous contrast.
CONTRAST:  10mL EOVIST GADOXETATE DISODIUM 0.25 MOL/L IV SOLN

[Series 2: T2 · coronal · 6.0mm · 1.17mm/px · 2 of 31 slices shown (1 of 2)]
[im 1/31]
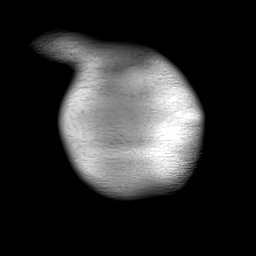
[im 31/31]
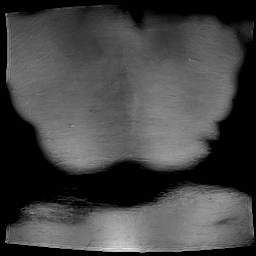

[Series 3: T1 · axial · 3.0mm · 0.94mm/px · z∈[-93,+168]mm · 4 of 88 slices shown (1 of 2)]
[im 1/88]
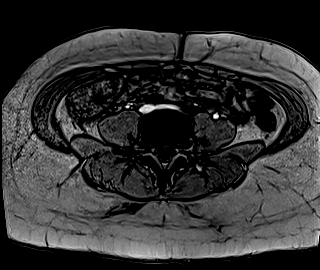
[im 30/88]
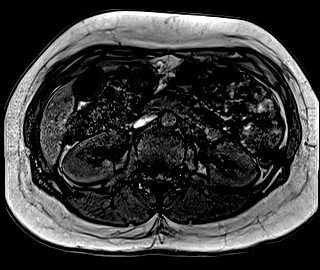
[im 59/88]
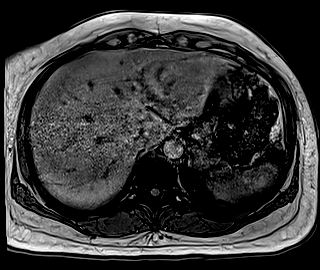
[im 88/88]
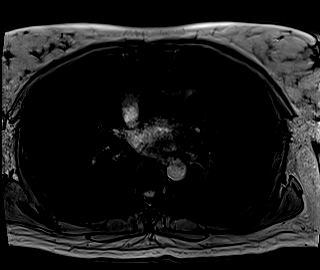

[Series 4: T1 · axial · 3.0mm · 0.94mm/px · z∈[-93,+168]mm · 4 of 88 slices shown (2 of 2)]
[im 1/88]
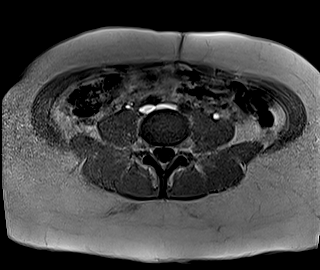
[im 30/88]
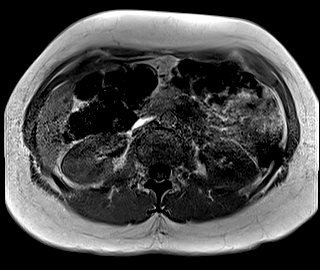
[im 59/88]
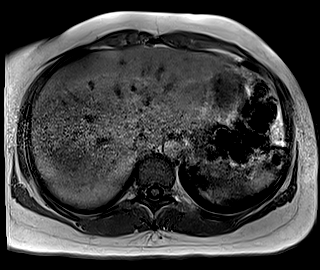
[im 88/88]
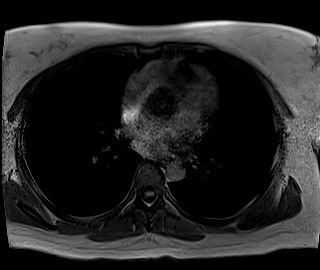

[Series 6: T1 dynamic · axial · 3.0mm · 0.94mm/px · z∈[-95,+166]mm · 3 of 88 slices shown (1 of 8)]
[im 1/88]
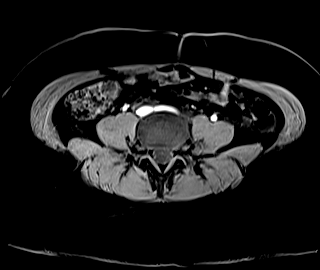
[im 44/88]
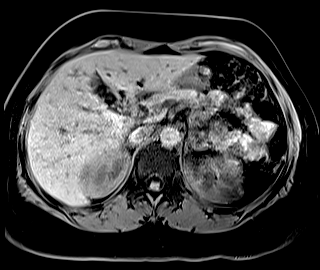
[im 88/88]
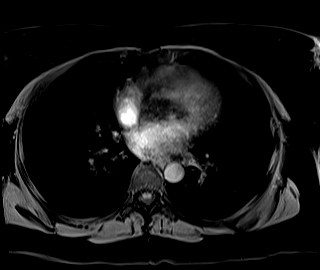

[Series 10: T1 dynamic · axial · 3.0mm · 0.94mm/px · z∈[-95,+166]mm · 3 of 88 slices shown (2 of 8)]
[im 1/88]
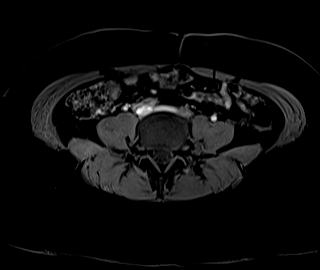
[im 44/88]
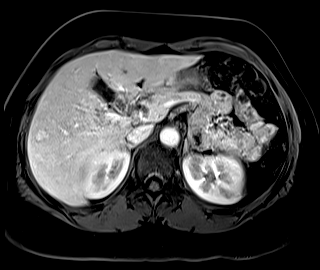
[im 88/88]
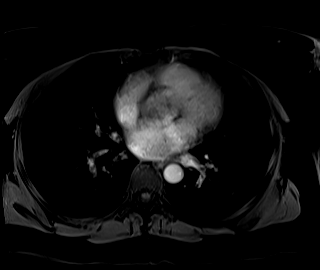

[Series 11: T1 dynamic · axial · 3.0mm · 0.94mm/px · z∈[-95,+166]mm · 3 of 88 slices shown (3 of 8)]
[im 1/88]
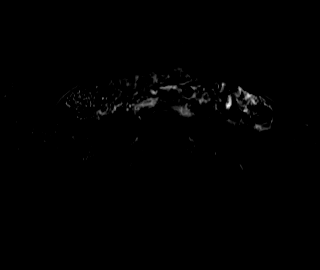
[im 44/88]
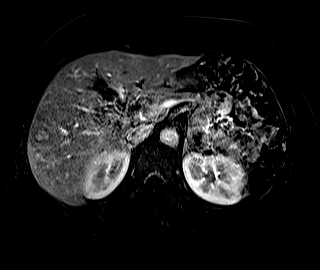
[im 88/88]
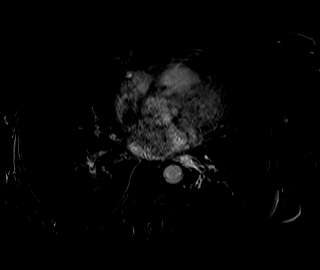

[Series 14: T1 dynamic · axial · 3.0mm · 0.94mm/px · z∈[-95,+166]mm · 3 of 88 slices shown (4 of 8)]
[im 1/88]
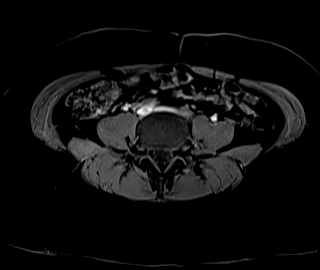
[im 44/88]
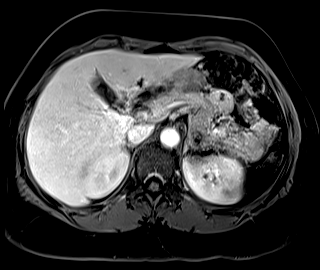
[im 88/88]
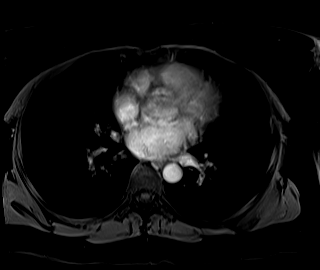

[Series 15: T1 dynamic · axial · 3.0mm · 0.94mm/px · z∈[-95,+166]mm · 3 of 88 slices shown (5 of 8)]
[im 1/88]
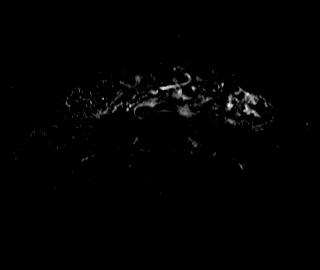
[im 44/88]
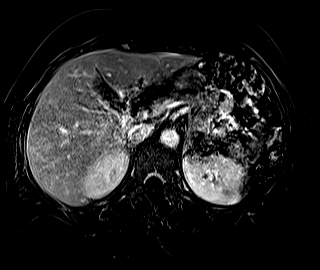
[im 88/88]
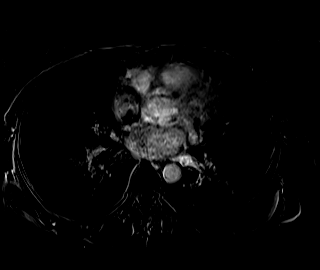

[Series 18: T1 dynamic · axial · 3.0mm · 0.94mm/px · z∈[-95,+166]mm · 3 of 88 slices shown (6 of 8)]
[im 1/88]
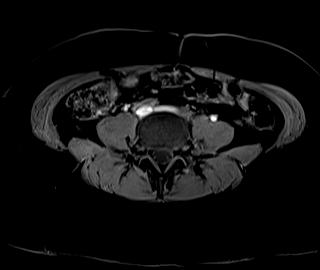
[im 44/88]
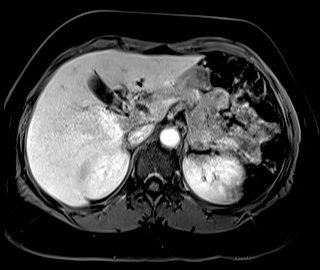
[im 88/88]
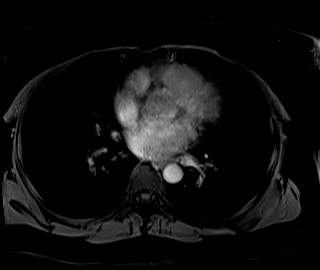

[Series 19: T1 dynamic · axial · 3.0mm · 0.94mm/px · z∈[-95,+166]mm · 3 of 88 slices shown (7 of 8)]
[im 1/88]
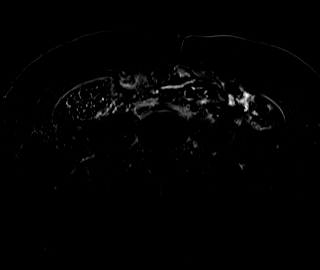
[im 44/88]
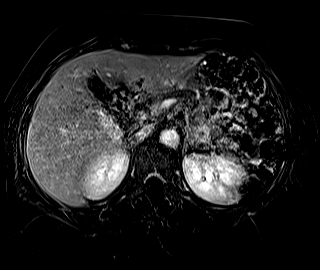
[im 88/88]
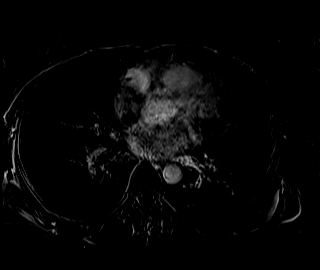

[Series 21: T1 dynamic · coronal · 3.0mm · 0.94mm/px · 3 of 80 slices shown (8 of 8)]
[im 1/80]
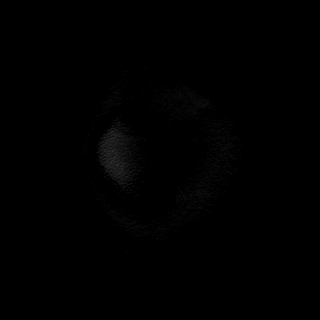
[im 40/80]
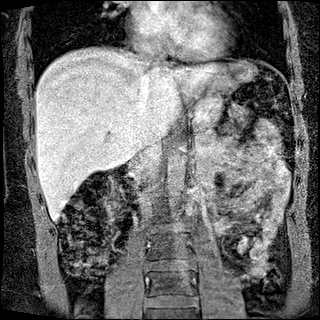
[im 80/80]
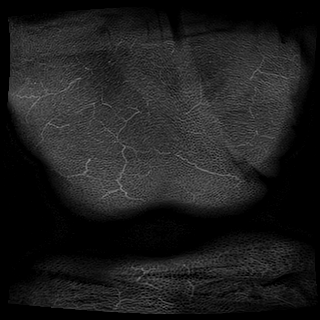

[Series 22: T2 · axial · 6.0mm · 1.17mm/px · 1 of 36 slices shown (2 of 2)]
[im 1/36]
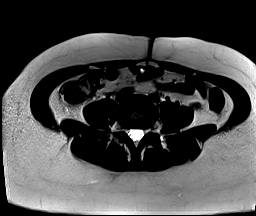

[Series 24: T2 fat-sat · axial · 6.0mm · 0.59mm/px · 1 of 30 slices shown]
[im 1/30]
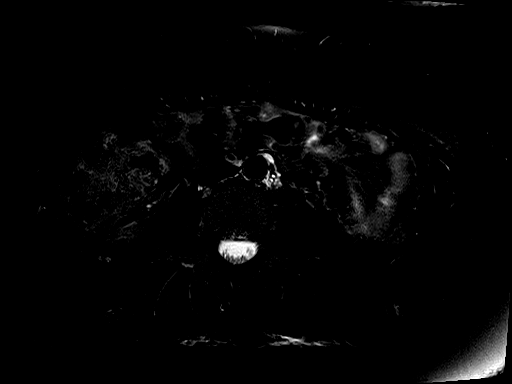

[Series 25: bSSFP · axial · 4.0mm · 0.59mm/px · z∈[-82,+166]mm · 2 of 63 slices shown]
[im 1/63]
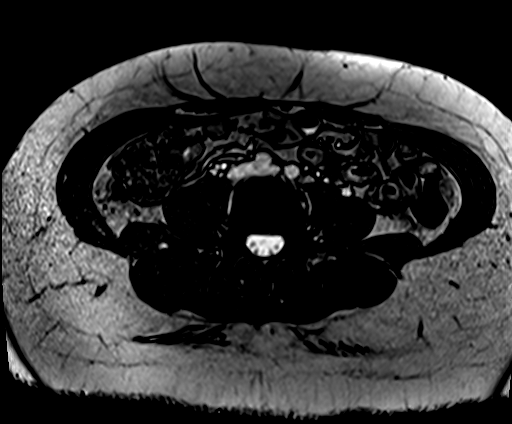
[im 63/63]
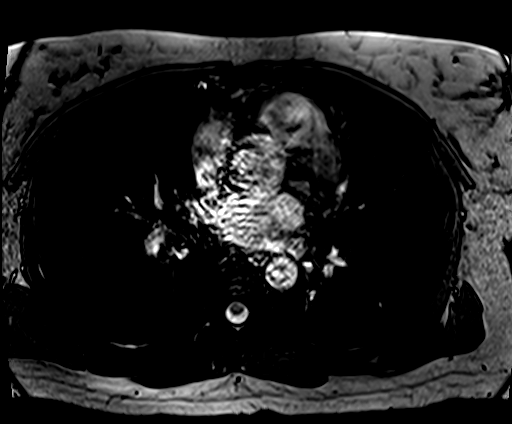

[Series 26: DWI · axial · 6.0mm · 1.36mm/px · z∈[-101,+173]mm · 3 of 78 slices shown (1 of 2)]
[im 1/78]
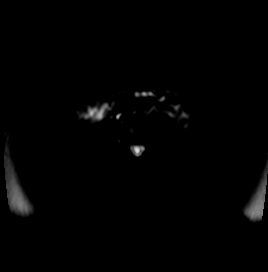
[im 39/78]
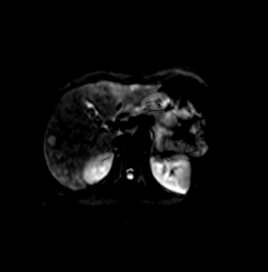
[im 78/78]
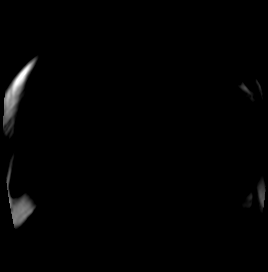

[Series 27: DWI · axial · 6.0mm · 1.36mm/px · 1 of 39 slices shown (2 of 2)]
[im 1/39]
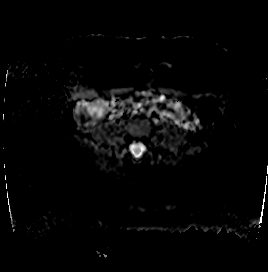

[Series 30: T1 dynamic fat-sat · axial · 3.0mm · 0.94mm/px · z∈[-95,+166]mm · 3 of 88 slices shown (1 of 2)]
[im 1/88]
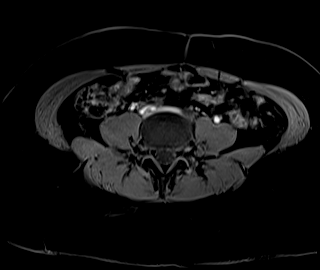
[im 44/88]
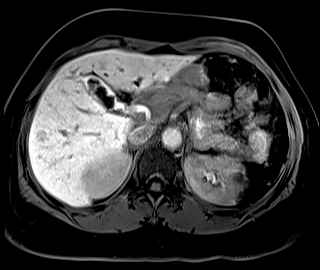
[im 88/88]
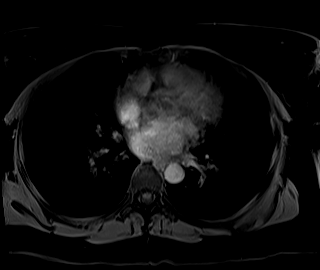

[Series 31: T1 dynamic fat-sat · axial · 3.0mm · 0.94mm/px · 1 of 88 slices shown (2 of 2)]
[im 1/88]
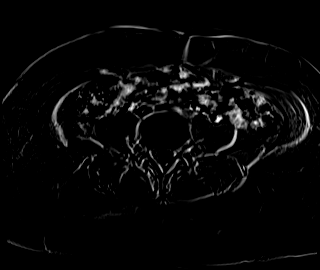

[46 of 48 positions shown; findings below may reference images not displayed]

FINDINGS: Despite efforts by the technologist and patient, motion artifact is
present on today's exam and could not be eliminated. This reduces
exam sensitivity and specificity.

Lower chest: Unremarkable

Hepatobiliary: Numerous gallstones are present in the gallbladder.
No biliary dilatation. No significant hepatic steatosis.

Scattered liver lesions are present and demonstrate generally very
faintly accentuated T2 signal; isointensity precontrast on T1
weighted images; arterial phase enhancement; portal venous phase
isoenhancement; and 20 minutes delayed hepatocellular phase
hypoenhancement relative to the hepatic parenchyma. No definite
background pattern of cirrhosis. No well-defined fatty content of
the lesions. The dominant lesion is in segment 4b of the liver and
measures about 3.4 by 2.4 cm on image 21 of series 22. This largest
lesion has slightly more portal venous phase enhancement in the
other lesions appear another representative lesion in the right
hepatic lobe measures 1.4 by 0.9 cm on image 44/10 and has a slight
pseudo capsule appearance on the arterial phase images but not on
any other imaging sequence.

Pancreas:  Unremarkable

Spleen:  Unremarkable

Adrenals/Urinary Tract:  Unremarkable

Stomach/Bowel: Prominent stool throughout the colon favors
constipation.

Vascular/Lymphatic:  Unremarkable

Other:  No supplemental non-categorized findings.

Musculoskeletal: Unremarkable
IMPRESSION: 1. Scattered liver lesions are present and demonstrate very faintly
accentuated T2 signal, portal venous phase enhancement, and 20
minutes delayed hepatocellular phase hypoenhancement relative to the
hepatic parenchyma. No well-defined fatty content of the lesions.
Given the lack of cirrhosis, the most likely cause is hepatic
adenomatosis. Multifocal hepatocellular carcinoma seems
substantially less likely in the absence of cirrhosis although well
differentiated hepatocellular carcinoma could conceivably have a
similar appearance. Most of the lesions are hypoenhancing relative
to the liver on the delayed 20 minutes images (particularly
appreciable on the subtraction images) and accordingly multifocal
FNH is less likely. Hepatic epithelioid hemangioendothelioma is less
likely given the lack of targetoid enhancement in given the lack of
confluence. Hypervascular metastatic disease to the liver seems less
likely given the overall enhancement pattern in the lack of
substantial hypoenhancement on delayed phase images. Reasonable
options in this case may include tissue diagnosis by biopsy, versus
follow up hepatic protocol MRI in 3-6 months to assess stability. I
would suggest obtaining alpha fetoprotein levels, if elevated than
the case for biopsy would be enhanced. Also I would suggest checking
liver enzyme levels.
2. Numerous gallstones in the gallbladder.
3. Prominent stool throughout the colon favors constipation.
4. Despite efforts by the technologist and patient, motion artifact
is present on today's exam and could not be eliminated. This reduces
exam sensitivity and specificity.

## 2019-09-29 MED ORDER — GADOXETATE DISODIUM 0.25 MMOL/ML IV SOLN
8.0000 mL | Freq: Once | INTRAVENOUS | Status: AC | PRN
  Administered 2019-09-29: 10 mL via INTRAVENOUS

## 2019-10-06 ENCOUNTER — Other Ambulatory Visit: Payer: Self-pay | Admitting: Family Medicine

## 2019-10-06 DIAGNOSIS — K769 Liver disease, unspecified: Secondary | ICD-10-CM

## 2019-12-22 ENCOUNTER — Other Ambulatory Visit: Payer: 59

## 2019-12-29 ENCOUNTER — Ambulatory Visit
Admission: RE | Admit: 2019-12-29 | Discharge: 2019-12-29 | Disposition: A | Discharge status: Home / Self Care | Payer: 59 | Source: Ambulatory Visit | Attending: Family Medicine | Admitting: Family Medicine

## 2019-12-29 DIAGNOSIS — K769 Liver disease, unspecified: Secondary | ICD-10-CM

## 2019-12-29 IMAGING — US US ABDOMEN LIMITED
1 series · 14 of 25 positions shown · non-contrast
Comparison: MR abdomen [DATE]

CLINICAL DATA: Liver lesions. No abdominal pain. No nausea,
vomiting or diarrhea.

EXAM:
ULTRASOUND ABDOMEN LIMITED RIGHT UPPER QUADRANT

[Series 1: us abdomen limited · 0.17mm/px · 64 acquisitions, 14 frames shown]
[im 1/64]
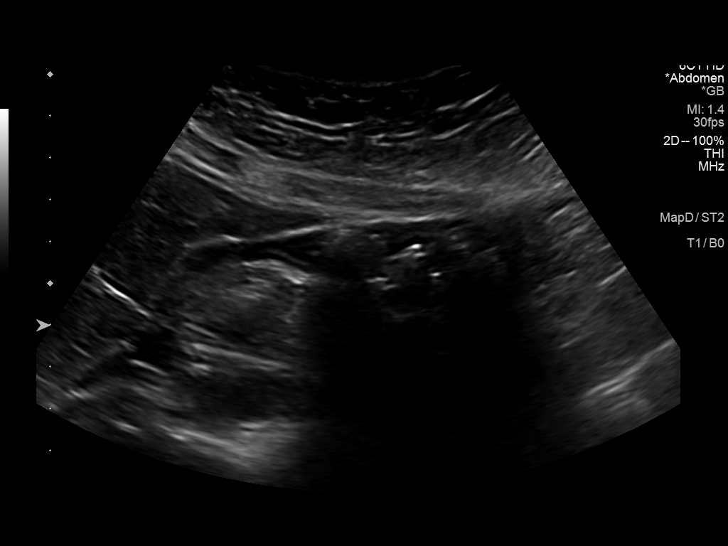
[im 6/64]
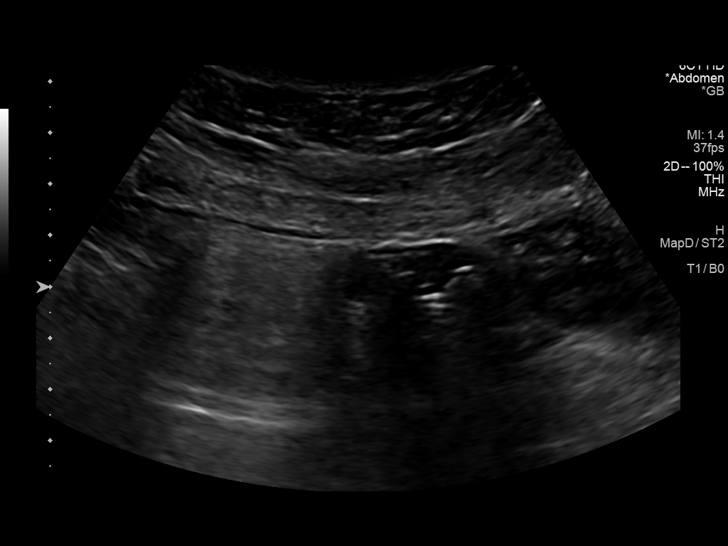
[im 11/64]
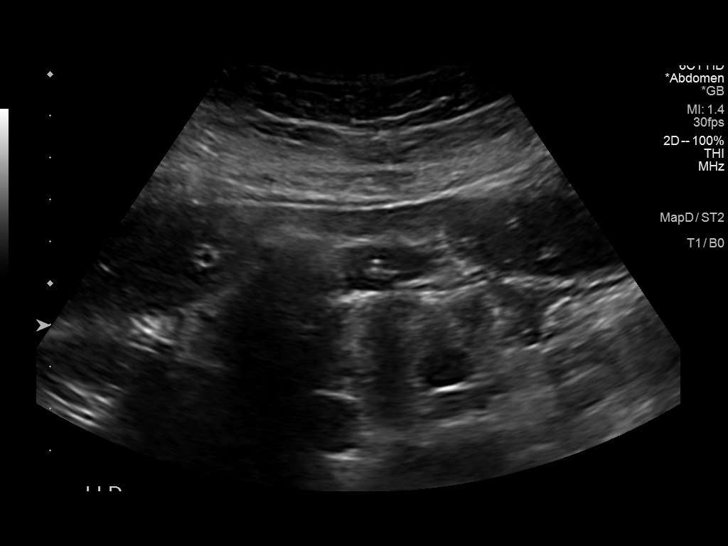
[im 16/64]
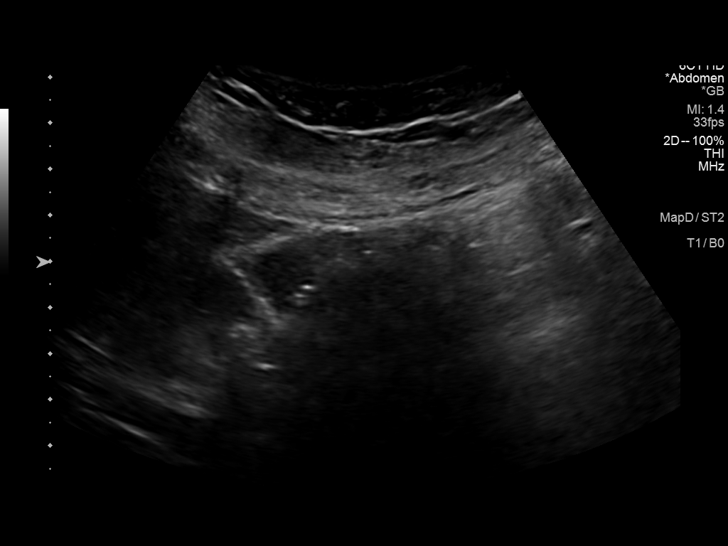
[im 22/64]
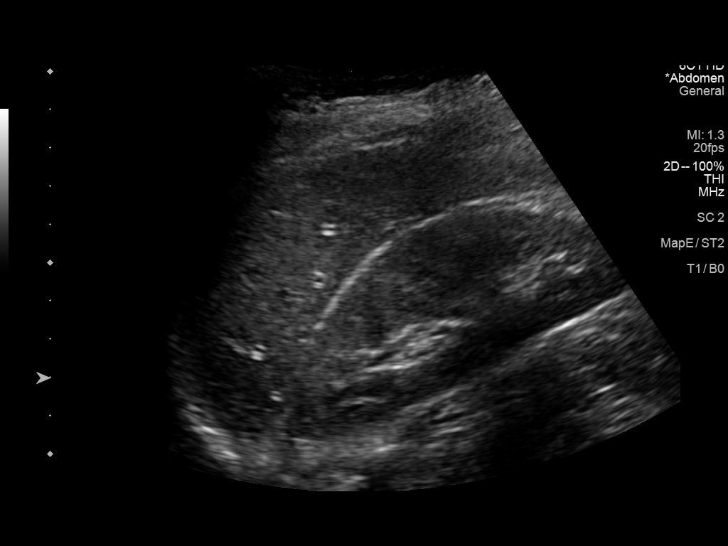
[im 24/64]
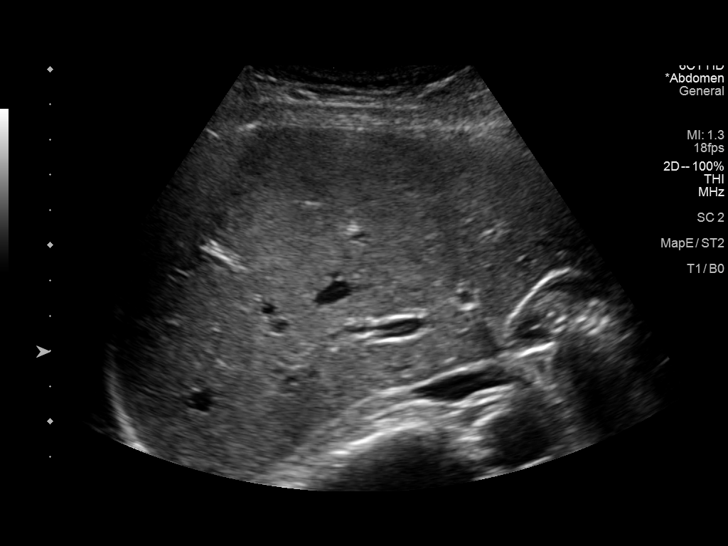
[im 29/64]
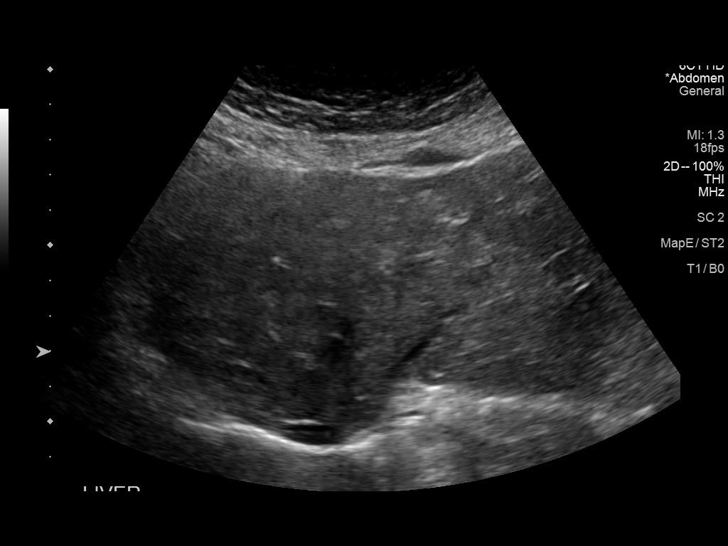
[im 35/64]
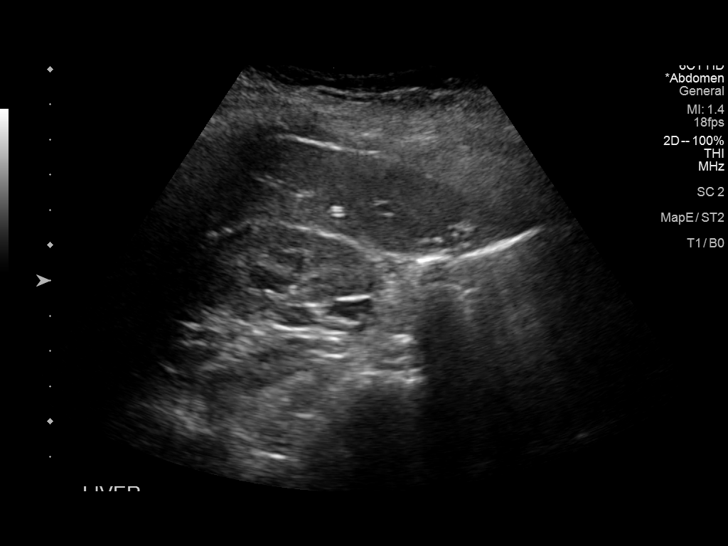
[im 40/64]
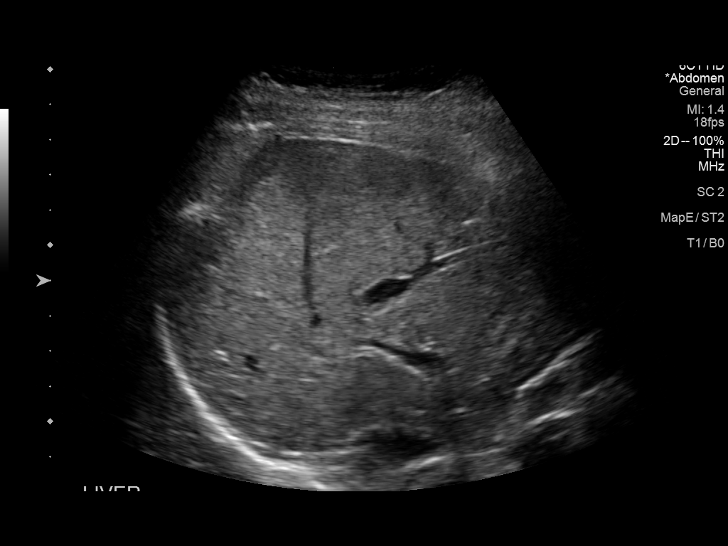
[im 43/64]
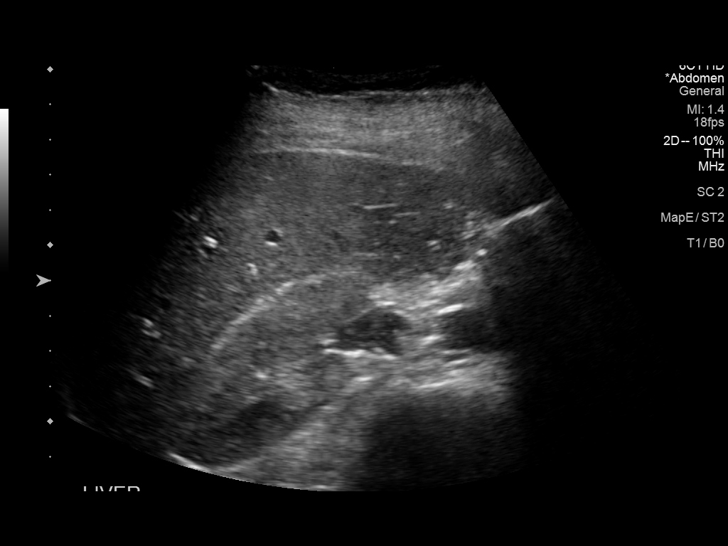
[im 48/64]
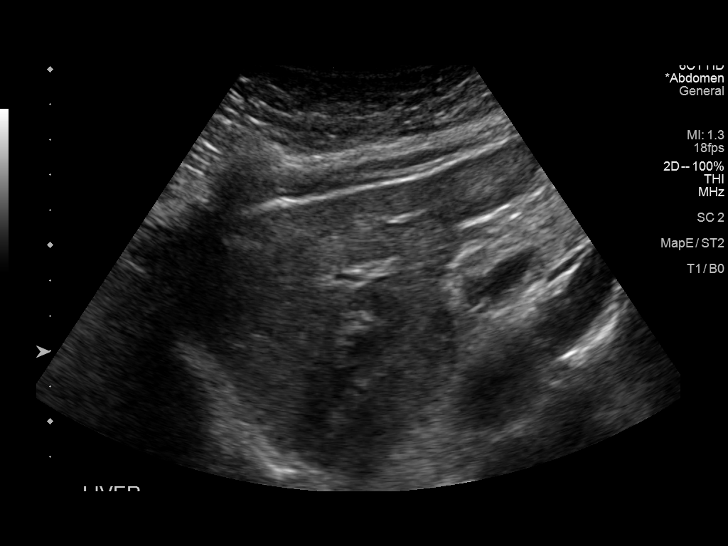
[im 53/64]
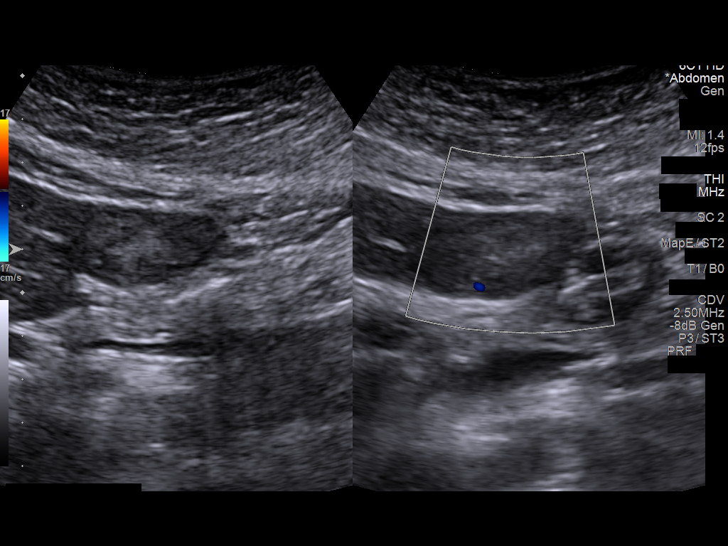
[im 58/64]
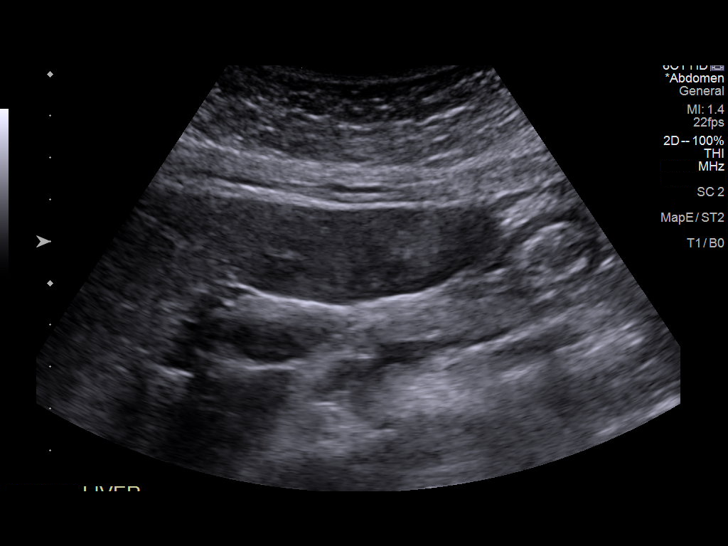
[im 64/64]
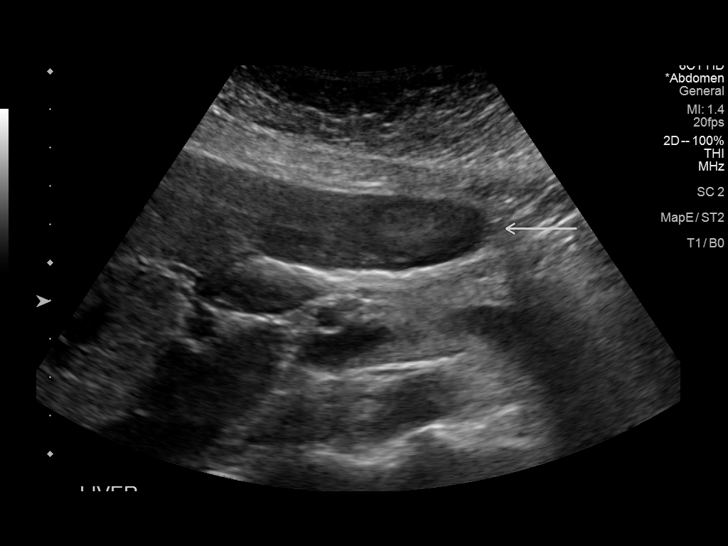

[14 of 25 positions shown; findings below may reference images not displayed]

FINDINGS: Gallbladder:

Cholelithiasis without pericholecystic fluid or gallbladder wall
thickening. Negative sonographic Murphy sign.

Common bile duct:

Diameter: 4.1 mm

Liver:

Solid 3.5 x 1.8 x 2.8 cm left hepatic mass of indeterminate
etiology. Within normal limits in parenchymal echogenicity. Portal
vein is patent on color Doppler imaging with normal direction of
blood flow towards the liver.

Other: None.
IMPRESSION: 1. Cholelithiasis without sonographic evidence of acute
cholecystitis.
2. Solid 3.5 x 1.8 x 2.8 cm left hepatic mass of indeterminate
etiology. The area was better characterized by MRI performed
[DATE] at which time a follow-up MRI was recommended in 3-6
months to evaluate stability and check alpha fetoprotein levels.

## 2020-02-08 ENCOUNTER — Other Ambulatory Visit: Payer: Self-pay | Admitting: Gastroenterology

## 2020-02-08 DIAGNOSIS — K769 Liver disease, unspecified: Secondary | ICD-10-CM

## 2020-02-27 ENCOUNTER — Ambulatory Visit
Admission: RE | Admit: 2020-02-27 | Discharge: 2020-02-27 | Disposition: A | Discharge status: Home / Self Care | Payer: 59 | Source: Ambulatory Visit | Attending: Gastroenterology | Admitting: Gastroenterology

## 2020-02-27 ENCOUNTER — Other Ambulatory Visit: Payer: Self-pay

## 2020-02-27 ENCOUNTER — Other Ambulatory Visit: Payer: Self-pay | Admitting: Gastroenterology

## 2020-02-27 DIAGNOSIS — K769 Liver disease, unspecified: Secondary | ICD-10-CM

## 2020-02-27 IMAGING — MR MR ABDOMEN WO/W CM
13 of 19 series · 30 of 48 positions shown · IV contrast (17ML MULTIHANCE)
Comparison: MRI [DATE].  CT scan [DATE].

CLINICAL DATA: Multiple hepatic lesions on prior imaging.

EXAM:
MRI ABDOMEN WITHOUT AND WITH CONTRAST
TECHNIQUE: Multiplanar multisequence MR imaging of the abdomen was performed
both before and after the administration of intravenous contrast.
CONTRAST:  17mL MULTIHANCE GADOBENATE DIMEGLUMINE 529 MG/ML IV SOLN

[Series 2: cor haste · coronal · 5.0mm · 0.68mm/px · 2 of 35 slices shown]
[im 1/35]
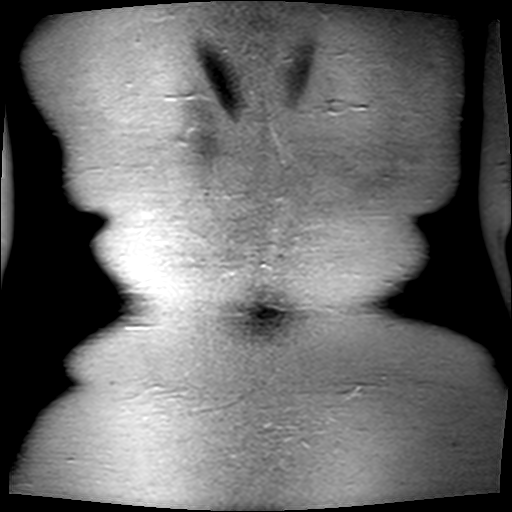
[im 35/35]
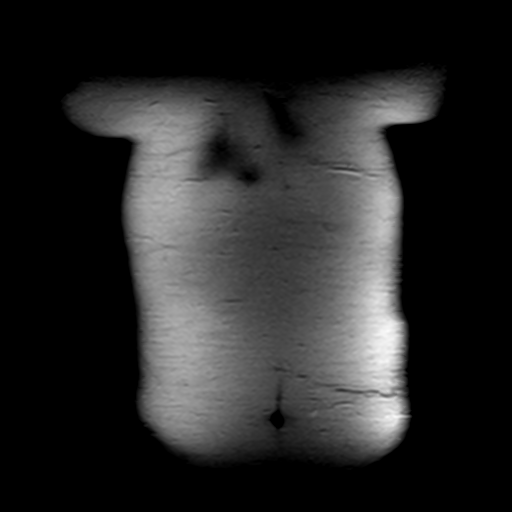

[Series 3: axial haste · axial · 6.0mm · 0.68mm/px · 1 of 33 slices shown]
[im 1/33]
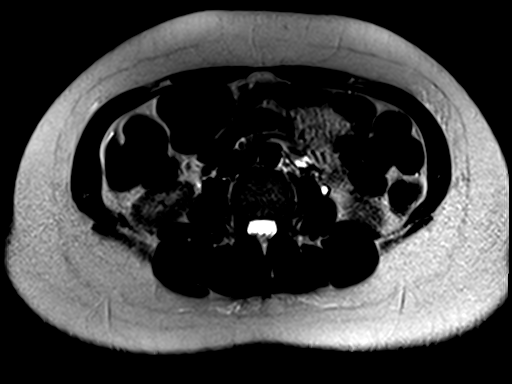

[Series 4: T1 · axial · 6.0mm · 0.68mm/px · z∈[-55,+176]mm · 2 of 72 slices shown]
[im 1/72]
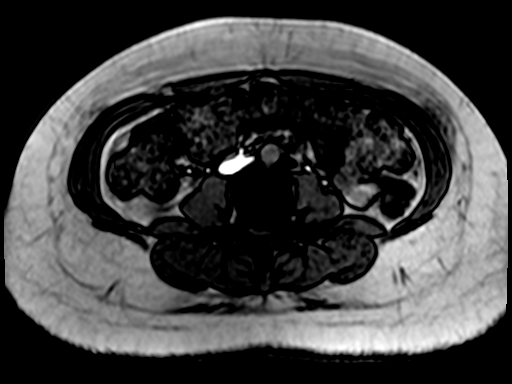
[im 72/72]
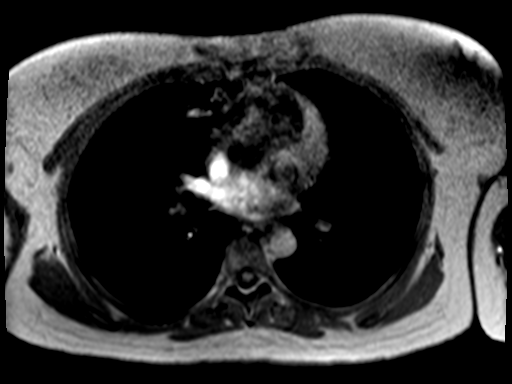

[Series 5: bSSFP · axial · 4.0mm · 0.68mm/px · z∈[-59,+181]mm · 2 of 61 slices shown]
[im 1/61]
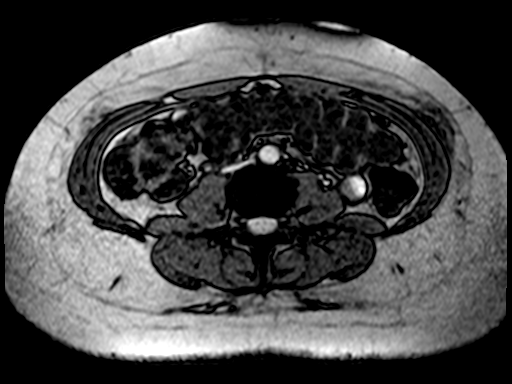
[im 61/61]
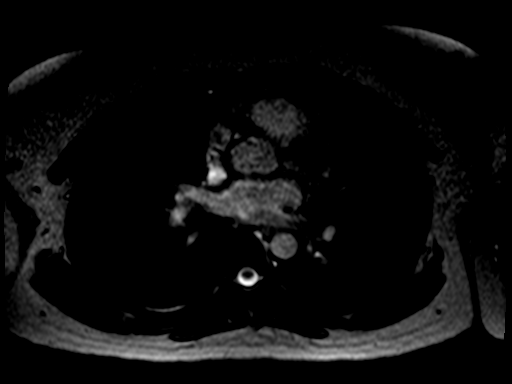

[Series 7: ep2d_diff_b50_500_800_p2_trig · axial · 6.0mm · 1.82mm/px · z∈[-65,+187]mm · 4 of 107 slices shown (1 of 2)]
[im 1/107]
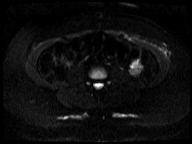
[im 36/107]
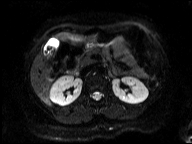
[im 71/107]
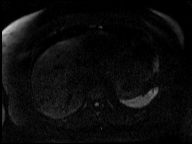
[im 107/107]
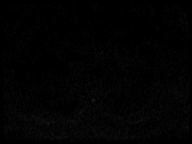

[Series 8: ep2d_[id number]_500_800_p2_trig_adc · axial · 6.0mm · 1.82mm/px · 1 of 36 slices shown (1 of 2)]
[im 1/36]
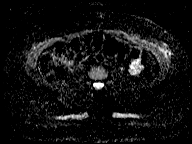

[Series 9: T2 · axial · 6.0mm · 1.09mm/px · 1 of 36 slices shown]
[im 1/36]
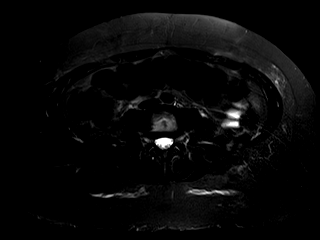

[Series 10: ep2d_diff_b50_500_800_p2_trig · axial · 6.0mm · 1.82mm/px · z∈[-65,+187]mm · 4 of 106 slices shown (2 of 2)]
[im 1/106]
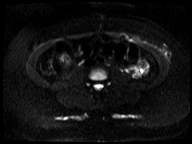
[im 36/106]
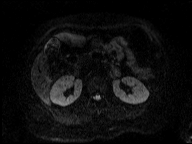
[im 71/106]
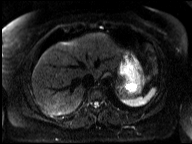
[im 106/106]
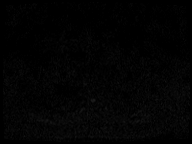

[Series 11: ep2d_diff_b50_500_800_p2_trig_adc · axial · 6.0mm · 1.82mm/px · 1 of 36 slices shown (2 of 2)]
[im 1/36]
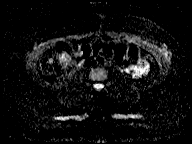

[Series 12: T1 dynamic · axial · non-contrast · 2.5mm · 0.74mm/px · z∈[-49,+168]mm · 3 of 88 slices shown]
[im 1/88]
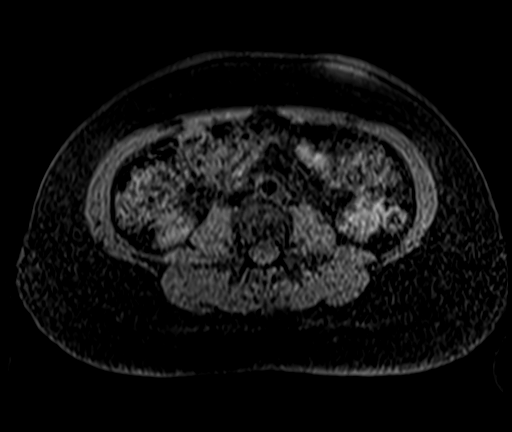
[im 44/88]
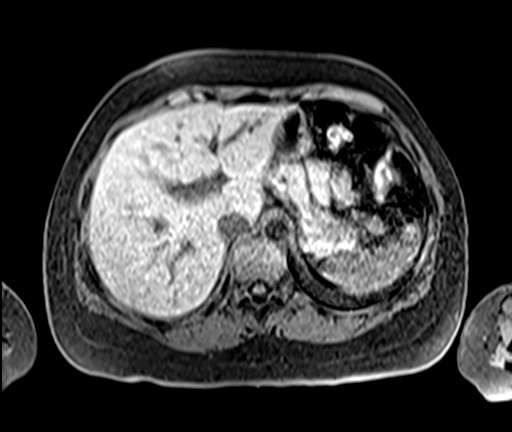
[im 88/88]
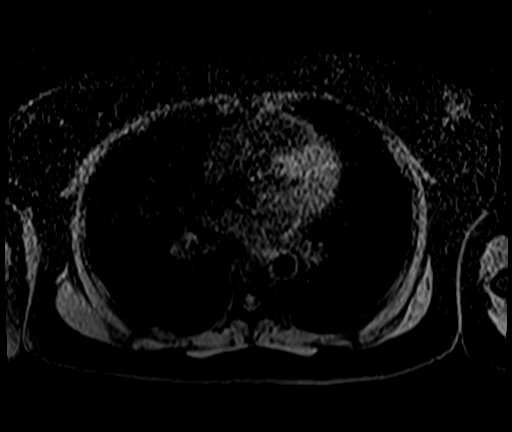

[Series 13: T1 dynamic post-contrast · axial · 2.5mm · 0.74mm/px · z∈[-49,+168]mm · 3 of 88 slices shown (1 of 3)]
[im 1/88]
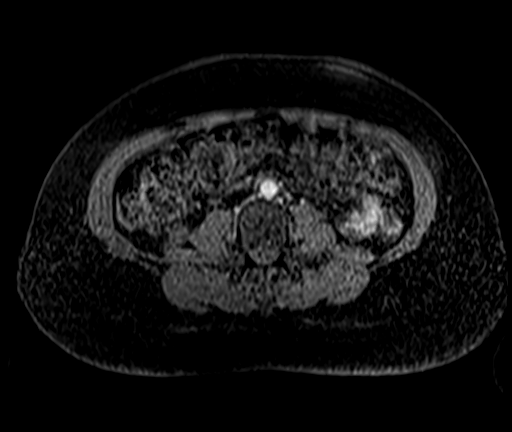
[im 44/88]
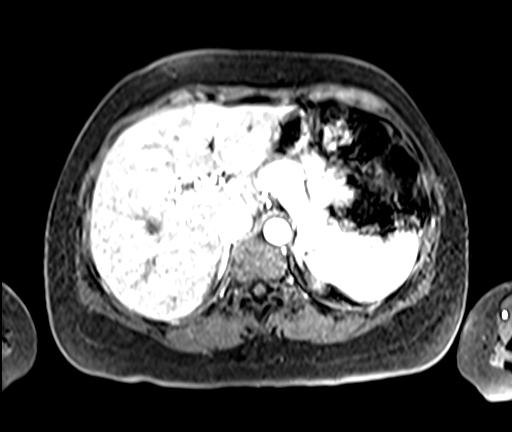
[im 88/88]
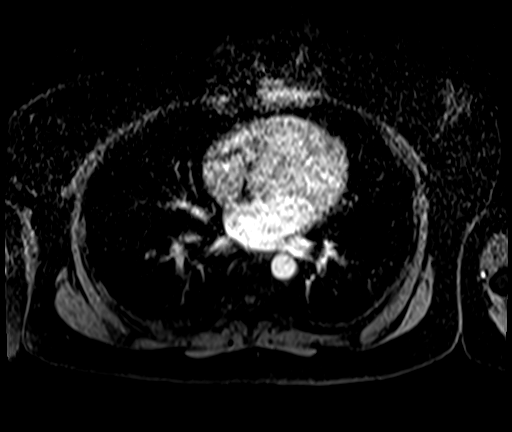

[Series 14: T1 dynamic post-contrast · axial · 2.5mm · 0.74mm/px · z∈[-49,+168]mm · 3 of 88 slices shown (2 of 3)]
[im 1/88]
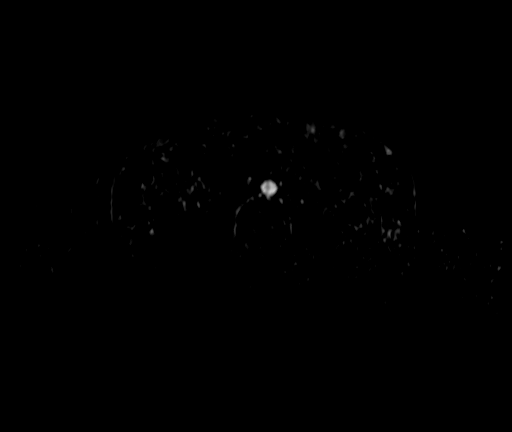
[im 44/88]
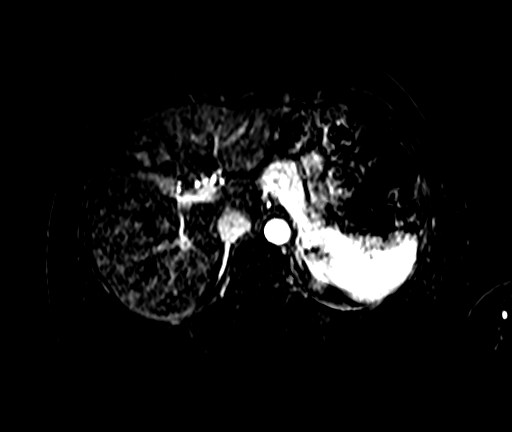
[im 88/88]
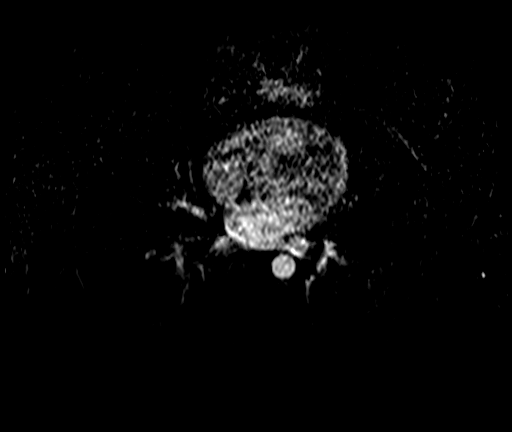

[Series 15: T1 dynamic post-contrast · axial · 2.5mm · 0.74mm/px · z∈[-49,+168]mm · 3 of 88 slices shown (3 of 3)]
[im 1/88]
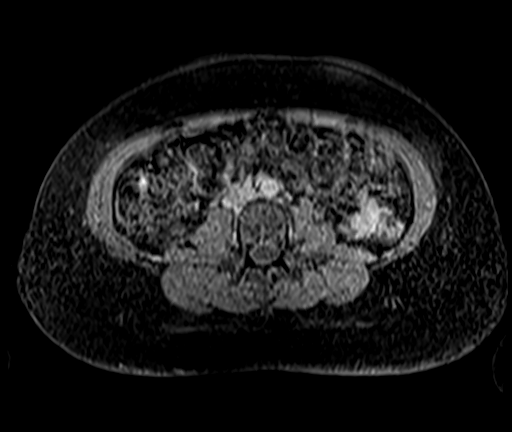
[im 44/88]
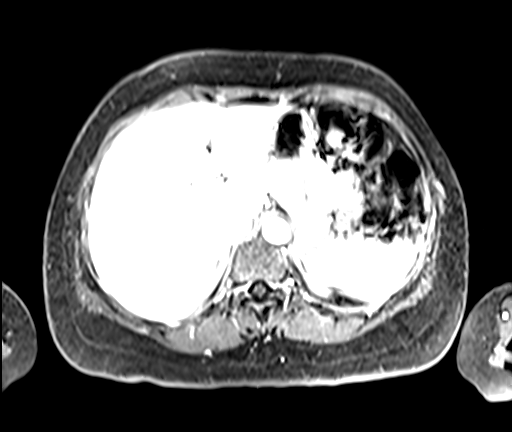
[im 88/88]
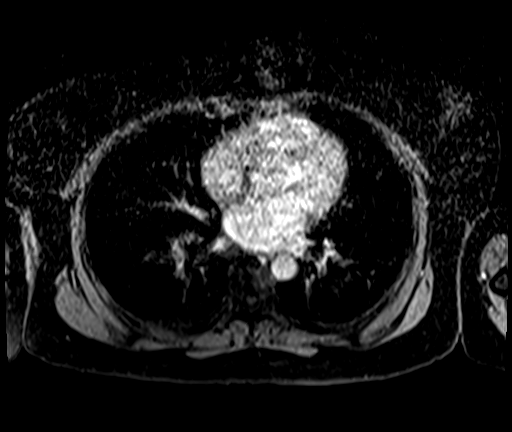

[30 of 48 positions shown; findings below may reference images not displayed]

FINDINGS: Today's exam is mildly motion degraded.

Lower chest: Unremarkable.

Hepatobiliary: Liver parenchyma remains heterogeneous. As on the
prior study, there are multiple small subtle lesions scattered
throughout the hepatic parenchyma. One of the more dominant dominant
lesions is identified in the lateral right liver, best seen on
arterial phase postcontrast T1 imaging (image 48 of series 13)
measuring 1.2 x 1.0 cm today which compares to 1.4 x 0.9 cm on
previous MRI.

The dominant lesion measured previously in segment IV B at 3.4 x
cm measures 3.2 x 2.0 cm today. No restricted diffusion.

Lesions were well appreciated on delayed hepatocyte phase imaging on
the previous exam, but given different contrast ceases today,
hepatocytes specific imaging was not performed. As such, lesions are
really best appreciated on arterial phase imaging and appear stable
when comparing to arterial phase imaging of the previous exam.

Numerous gallstones evident. No intrahepatic or extrahepatic biliary
dilation. Common duct measures upper normal at 5-6 mm diameter.

Pancreas: No focal mass lesion. No dilatation of the main duct. No
intraparenchymal cyst. No peripancreatic edema.

Spleen:  No splenomegaly. No focal mass lesion.

Adrenals/Urinary Tract: No adrenal nodule or mass. Kidneys
unremarkable.

Stomach/Bowel: Stomach is unremarkable. No gastric wall thickening.
No evidence of outlet obstruction. Duodenum is normally positioned
as is the ligament of Treitz. Insert normal bowel

Vascular/Lymphatic: No abdominal aortic aneurysm. There is no
gastrohepatic or hepatoduodenal ligament lymphadenopathy. No
retroperitoneal or mesenteric lymphadenopathy.

Other:  No intraperitoneal free fluid.

Musculoskeletal: No focal suspicious marrow enhancement within the
visualized bony anatomy.
IMPRESSION: 1. Stable exam. Multiple small subtle lesions scattered throughout
the hepatic parenchyma remain indeterminate. No overtly suspicious
imaging features on today's study and interval 5 month stability is
reassuring. Follow-up MRI in 6-12 months recommended to ensure
continued stability.
2. Cholelithiasis. No intrahepatic or extrahepatic biliary dilation.

## 2020-02-27 MED ORDER — GADOBENATE DIMEGLUMINE 529 MG/ML IV SOLN
17.0000 mL | Freq: Once | INTRAVENOUS | Status: AC | PRN
  Administered 2020-02-27: 17 mL via INTRAVENOUS

## 2020-08-08 ENCOUNTER — Ambulatory Visit (INDEPENDENT_AMBULATORY_CARE_PROVIDER_SITE_OTHER): Payer: Self-pay | Admitting: Family Medicine

## 2020-08-08 DIAGNOSIS — R5383 Other fatigue: Secondary | ICD-10-CM

## 2020-08-08 DIAGNOSIS — Z1331 Encounter for screening for depression: Secondary | ICD-10-CM

## 2020-08-08 DIAGNOSIS — Z5329 Procedure and treatment not carried out because of patient's decision for other reasons: Secondary | ICD-10-CM

## 2020-08-08 DIAGNOSIS — R0602 Shortness of breath: Secondary | ICD-10-CM

## 2020-08-22 ENCOUNTER — Ambulatory Visit (INDEPENDENT_AMBULATORY_CARE_PROVIDER_SITE_OTHER): Payer: Self-pay | Admitting: Family Medicine

## 2021-01-23 ENCOUNTER — Other Ambulatory Visit: Payer: Self-pay | Admitting: Gastroenterology

## 2021-01-23 DIAGNOSIS — K769 Liver disease, unspecified: Secondary | ICD-10-CM

## 2021-02-02 ENCOUNTER — Other Ambulatory Visit: Payer: Self-pay

## 2021-02-02 ENCOUNTER — Ambulatory Visit
Admission: RE | Admit: 2021-02-02 | Discharge: 2021-02-02 | Disposition: A | Discharge status: Home / Self Care | Payer: 59 | Source: Ambulatory Visit | Attending: Gastroenterology | Admitting: Gastroenterology

## 2021-02-02 DIAGNOSIS — K769 Liver disease, unspecified: Secondary | ICD-10-CM

## 2021-02-02 MED ORDER — GADOBENATE DIMEGLUMINE 529 MG/ML IV SOLN
18.0000 mL | Freq: Once | INTRAVENOUS | Status: AC | PRN
  Administered 2021-02-02: 18 mL via INTRAVENOUS

## 2021-03-29 ENCOUNTER — Other Ambulatory Visit: Payer: Self-pay

## 2021-03-29 ENCOUNTER — Encounter: Payer: Self-pay | Admitting: Allergy

## 2021-03-29 ENCOUNTER — Ambulatory Visit: Payer: 59 | Admitting: Allergy

## 2021-03-29 VITALS — BP 110/66 | HR 101 | Temp 97.6°F | Resp 12 | Ht 66.0 in | Wt 197.8 lb

## 2021-03-29 DIAGNOSIS — L503 Dermatographic urticaria: Secondary | ICD-10-CM | POA: Diagnosis not present

## 2021-03-29 DIAGNOSIS — J45909 Unspecified asthma, uncomplicated: Secondary | ICD-10-CM | POA: Insufficient documentation

## 2021-03-29 DIAGNOSIS — L299 Pruritus, unspecified: Secondary | ICD-10-CM

## 2021-03-29 DIAGNOSIS — L508 Other urticaria: Secondary | ICD-10-CM

## 2021-03-29 MED ORDER — CETIRIZINE HCL 10 MG PO TABS: 10.0000 mg | ORAL_TABLET | Freq: Two times a day (BID) | ORAL | 5 refills | Status: AC

## 2021-03-29 MED ORDER — FAMOTIDINE 20 MG PO TABS
20.0000 mg | ORAL_TABLET | Freq: Two times a day (BID) | ORAL | 5 refills | Status: AC
Start: 2021-03-29 — End: ?

## 2021-03-29 MED ORDER — MONTELUKAST SODIUM 10 MG PO TABS: 10.0000 mg | ORAL_TABLET | Freq: Every day | ORAL | 5 refills | Status: AC

## 2021-03-29 NOTE — Addendum Note (Signed)
Addended by: Tommas Olp B on: 03/29/2021 12:34 PM ? ? Modules accepted: Orders ? ?

## 2021-03-29 NOTE — Patient Instructions (Addendum)
Hives and itching ? - at this time etiology of hives and swelling is unknown.  Hives can be caused by a variety of different triggers including illness/infection, foods, medications, stings, exercise, pressure, vibrations, extremes of temperature to name a few however majority of the time there is no identifiable trigger.  Your symptoms have been ongoing for >6 weeks making this chronic thus will obtain labwork to evaluate: CBC w diff, CMP, tryptase, hive panel, environmental panel, alpha-gal panel, h.pylori ab, nut IgE ?- for hive control recommend high-dose antihistamine regimen: Zyrtec '10mg'$  1 tab twice a day with Pepcid '20mg'$  1 tab twice a day.   If twice a day dosing is not effective in controlling hives then would add in Singulair.   If triple therapy regimen is not effective enough then would consider Xolair monthly injections for chronic spontaneous hives.  ? ?Follow-up in 2-3 months or sooner if needed ? ?

## 2021-03-29 NOTE — Progress Notes (Signed)
? ? ?New Patient Note ? ?RE: Alicia Russell MRN: [id number] DOB: February 01, 1975 ?Date of Office Visit: 03/29/2021 ? ?Primary care provider: Kathyrn Lass, MD ? ?Chief Complaint: hives and itching ? ?History of present illness: ?Alicia Russell is a 46 y.o. female presenting today for evaluation of urticaria with pruritus.   ? ?Several months ago around the end of 2022 she started having itching spells where she was itching all over and would have raised areas like hives on her body.  She states if she scratches she will see the raised welt area too.  She notices these hives/welts several times a week but she is itchy on a daily basis.  She also states her eyes and ear get itchy.  She has not introduced any new foods.  No stings/bites.  No swelling.  No joint aches/pains.  Hives last about an hour at a time. Hives does not leave any bruising marks behind.  ?Denies any preceding illness.  She does have adult acne and was on spironolactone and she discussed with her dermatologist her hives and they did not feel it was related and thus she still takes it twice a day. She started this medication in December and she had been having issues with itching and hives at that point already.  She has used cortisone or topical benadryl that would help temporally.  She does history of reflux years ago (was treated for H.pylori) and does feel like she has been having more reflux lately around these hives.  She also has a high stress job as well.  Since last year she has been using almond creamer in her coffee and she drinks silk almond milk thus almond is the one food she notes she typically has on a daily basis.  ? ? ?Review of systems: ?Review of Systems  ?Constitutional: Negative.   ?HENT: Negative.    ?     Itchy ear  ?Eyes:  Positive for itching.  ?Respiratory: Negative.    ?Cardiovascular: Negative.   ?Gastrointestinal: Negative.   ?Musculoskeletal: Negative.   ?Skin:  Positive for rash.  ?Allergic/Immunologic: Negative.    ?Neurological: Negative.   ? ?All other systems negative unless noted above in HPI ? ?Past medical history: ?Past Medical History:  ?Diagnosis Date  ? Anxiety   ? Asthma   ? Hypertension   ? ? ?Past surgical history: ?History reviewed. No pertinent surgical history. ? ?Family history:  ?Family History  ?Problem Relation Age of Onset  ? Allergic rhinitis Mother   ? ? ?Social history: ?Lives in a townhome with carpeting with electric heating and central cooling.  There is concern for water damage or mildew in the home.  No concern for roaches in the home.  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  She denies a smoking history. ? ? ?Medication List: ?Current Outpatient Medications  ?Medication Sig Dispense Refill  ? acetaminophen (TYLENOL) 325 MG tablet Take 325 mg by mouth every 6 (six) hours as needed for mild pain.    ? ALPRAZolam (XANAX) 1 MG tablet Take 0.5 mg by mouth daily as needed for anxiety.     ? amLODipine (NORVASC) 10 MG tablet Take 10 mg by mouth daily.     ? Cholecalciferol (VITAMIN D-3) 125 MCG (5000 UT) TABS Take 1 tablet by mouth daily.    ? Multiple Vitamin (MULTIVITAMIN ADULT PO) Take 1 tablet by mouth daily.    ? traZODone (DESYREL) 50 MG tablet Take 50  mg by mouth daily as needed for sleep.     ? Venlafaxine HCl 150 MG TB24 Take 150 mg by mouth daily.    ? venlafaxine XR (EFFEXOR-XR) 75 MG 24 hr capsule Take 75 mg by mouth daily.    ? zolpidem (AMBIEN) 10 MG tablet Take 10 mg by mouth at bedtime.     ? spironolactone (ALDACTONE) 50 MG tablet 1 tablet    ? Tazarotene (ARAZLO) 0.354 % LOTN 1 application    ? vitamin A 3 MG (10000 UNITS) capsule 1 capsule    ? ?No current facility-administered medications for this visit.  ? ? ?Known medication allergies: ?No Known Allergies ? ? ?Physical examination: ?Blood pressure 110/66, pulse (!) 101, temperature 97.6 ?F (36.4 ?C), temperature source Temporal, resp. rate 12, height '5\' 6"'$  (1.676 m), weight  197 lb 12.8 oz (89.7 kg), SpO2 100 %. ? ?General: Alert, interactive, in no acute distress. ?HEENT: PERRLA, TMs pearly gray, turbinates non-edematous without discharge, post-pharynx non erythematous. ?Neck: Supple without lymphadenopathy. ?Lungs: Clear to auscultation without wheezing, rhonchi or rales. {no increased work of breathing. ?CV: Normal S1, S2 without murmurs. ?Abdomen: Nondistended, nontender. ?Skin: Warm and dry, without lesions or rashes. ?Extremities:  No clubbing, cyanosis or edema. ?Neuro:   Grossly intact. ? ?Diagnositics/Labs: ? ?Allergy testing: Deferred due to dermatographia ? ? ?Assessment and plan: ?Chronic urticaria and pruritus ? - at this time etiology of hives and swelling is unknown.  Hives can be caused by a variety of different triggers including illness/infection, foods, medications, stings, exercise, pressure, vibrations, extremes of temperature to name a few however majority of the time there is no identifiable trigger.  Your symptoms have been ongoing for >6 weeks making this chronic thus will obtain labwork to evaluate: CBC w diff, CMP, tryptase, hive panel, environmental panel, alpha-gal panel, h.pylori ab, nut IgE ?- for hive control recommend high-dose antihistamine regimen: Zyrtec '10mg'$  1 tab twice a day with Pepcid '20mg'$  1 tab twice a day.   If twice a day dosing is not effective in controlling hives then would add in Singulair.   If triple therapy regimen is not effective enough then would consider Xolair monthly injections for chronic spontaneous hives.  ? ?Follow-up in 2-3 months or sooner if needed ? ? ?I appreciate the opportunity to take part in [place] care. Please do not hesitate to contact me with questions. ? ?Sincerely, ? ? ?Prudy Feeler, MD ?Allergy/Immunology ?[facility] ?

## 2021-03-31 LAB — IGE NUT PROF. W/COMPONENT RFLX
F017-IgE Hazelnut (Filbert): <0.1 kU/L
F018-IgE Brazil Nut: <0.1 kU/L
F020-IgE Almond: <0.1 kU/L
F202-IgE Cashew Nut: <0.1 kU/L
F203-IgE Pistachio Nut: <0.1 kU/L
F256-IgE Walnut: <0.1 kU/L
Macadamia Nut, IgE: <0.1 kU/L
Peanut, IgE: <0.1 kU/L
Pecan Nut IgE: <0.1 kU/L

## 2021-04-06 LAB — CBC WITH DIFFERENTIAL
Basophils Absolute: 0 10*3/uL (ref 0.0–0.2)
Basos: 1 %
EOS (ABSOLUTE): 0.2 10*3/uL (ref 0.0–0.4)
Eos: 2 %
Hematocrit: 39.7 % (ref 34.0–46.6)
Hemoglobin: 12.8 g/dL (ref 11.1–15.9)
Immature Grans (Abs): 0 10*3/uL (ref 0.0–0.1)
Immature Granulocytes: 0 %
Lymphocytes Absolute: 2.2 10*3/uL (ref 0.7–3.1)
Lymphs: 28 %
MCH: 27.4 pg (ref 26.6–33.0)
MCHC: 32.2 g/dL (ref 31.5–35.7)
MCV: 85 fL (ref 79–97)
Monocytes Absolute: 0.4 10*3/uL (ref 0.1–0.9)
Monocytes: 5 %
Neutrophils Absolute: 5 10*3/uL (ref 1.4–7.0)
Neutrophils: 64 %
RBC: 4.68 x10E6/uL (ref 3.77–5.28)
RDW: 12.8 % (ref 11.7–15.4)
WBC: 7.8 10*3/uL (ref 3.4–10.8)

## 2021-04-06 LAB — COMPREHENSIVE METABOLIC PANEL
ALT: 6 IU/L (ref 0–32)
AST: 11 IU/L (ref 0–40)
Albumin/Globulin Ratio: 1.8 (ref 1.2–2.2)
Albumin: 4.9 g/dL — ABNORMAL HIGH (ref 3.8–4.8)
Alkaline Phosphatase: 146 IU/L — ABNORMAL HIGH (ref 44–121)
BUN/Creatinine Ratio: 8 — ABNORMAL LOW (ref 9–23)
BUN: 7 mg/dL (ref 6–24)
Bilirubin Total: 0.3 mg/dL (ref 0.0–1.2)
CO2: 24 mmol/L (ref 20–29)
Calcium: 10 mg/dL (ref 8.7–10.2)
Chloride: 102 mmol/L (ref 96–106)
Creatinine, Ser: 0.83 mg/dL (ref 0.57–1.00)
Globulin, Total: 2.7 g/dL (ref 1.5–4.5)
Glucose: 74 mg/dL (ref 70–99)
Potassium: 4.6 mmol/L (ref 3.5–5.2)
Sodium: 140 mmol/L (ref 134–144)
Total Protein: 7.6 g/dL (ref 6.0–8.5)
eGFR: 89 mL/min/{1.73_m2} (ref >59)

## 2021-04-06 LAB — ALPHA-GAL PANEL
Allergen Lamb IgE: <0.1 kU/L
Beef IgE: <0.1 kU/L
IgE (Immunoglobulin E), Serum: 44 IU/mL (ref 6–495)
O215-IgE Alpha-Gal: <0.1 kU/L
Pork IgE: <0.1 kU/L

## 2021-04-06 LAB — ALLERGENS W/TOTAL IGE AREA 2
Alternaria Alternata IgE: <0.1 kU/L
Aspergillus Fumigatus IgE: <0.1 kU/L
Bermuda Grass IgE: <0.1 kU/L
Cat Dander IgE: <0.1 kU/L
Cedar, Mountain IgE: <0.1 kU/L
Cladosporium Herbarum IgE: <0.1 kU/L
Cockroach, German IgE: 0.15 kU/L — AB
Common Silver Birch IgE: <0.1 kU/L
Cottonwood IgE: <0.1 kU/L
D Farinae IgE: <0.1 kU/L
D Pteronyssinus IgE: 0.13 kU/L — AB
Dog Dander IgE: <0.1 kU/L
Elm, American IgE: <0.1 kU/L
Johnson Grass IgE: <0.1 kU/L
Maple/Box Elder IgE: <0.1 kU/L
Mouse Urine IgE: <0.1 kU/L
Oak, White IgE: <0.1 kU/L
Pecan, Hickory IgE: <0.1 kU/L
Penicillium Chrysogen IgE: <0.1 kU/L
Pigweed, Rough IgE: <0.1 kU/L
Ragweed, Short IgE: <0.1 kU/L
Sheep Sorrel IgE Qn: <0.1 kU/L
Timothy Grass IgE: <0.1 kU/L
White Mulberry IgE: <0.1 kU/L

## 2021-04-06 LAB — THYROID ANTIBODIES
Thyroglobulin Antibody: <1 IU/mL (ref 0.0–0.9)
Thyroperoxidase Ab SerPl-aCnc: <9 IU/mL (ref 0–34)

## 2021-04-06 LAB — TRYPTASE: Tryptase: 4.3 ug/L (ref 2.2–13.2)

## 2021-04-06 LAB — CHRONIC URTICARIA: cu index: 7.2 (ref <10)

## 2021-04-06 LAB — H PYLORI, IGM, IGG, IGA AB
H pylori, IgM Abs: 11.8 units — ABNORMAL HIGH (ref 0.0–8.9)
H. pylori, IgA Abs: <9 units (ref 0.0–8.9)
H. pylori, IgG AbS: 0.35 Index Value (ref 0.00–0.79)

## 2021-04-12 ENCOUNTER — Encounter: Payer: Self-pay | Admitting: Allergy

## 2021-04-12 ENCOUNTER — Other Ambulatory Visit: Payer: Self-pay | Admitting: *Deleted

## 2021-04-12 DIAGNOSIS — L508 Other urticaria: Secondary | ICD-10-CM

## 2021-04-12 MED ORDER — OMEPRAZOLE MAGNESIUM 20 MG PO TBEC: 20.0000 mg | DELAYED_RELEASE_TABLET | Freq: Two times a day (BID) | ORAL | 0 refills | Status: AC

## 2021-04-12 MED ORDER — AMOXICILLIN 500 MG PO TABS: 1000.0000 mg | ORAL_TABLET | Freq: Two times a day (BID) | ORAL | 0 refills | Status: AC

## 2021-04-12 MED ORDER — CLARITHROMYCIN 500 MG PO TABS: 500.0000 mg | ORAL_TABLET | Freq: Two times a day (BID) | ORAL | 0 refills | Status: AC

## 2021-05-31 ENCOUNTER — Ambulatory Visit: Payer: 59 | Admitting: Allergy

## 2021-06-08 ENCOUNTER — Ambulatory Visit: Payer: 59 | Admitting: Internal Medicine

## 2021-06-08 ENCOUNTER — Encounter: Payer: Self-pay | Admitting: Internal Medicine

## 2021-06-08 VITALS — BP 114/72 | HR 83 | Temp 97.9°F | Resp 16 | Ht 66.0 in | Wt 199.8 lb

## 2021-06-08 DIAGNOSIS — L501 Idiopathic urticaria: Secondary | ICD-10-CM | POA: Diagnosis not present

## 2021-06-08 NOTE — Progress Notes (Signed)
Follow Up Note  RE: Alicia Russell MRN: [id number] DOB: 31-Oct-1975 Date of Office Visit: 06/08/2021  Referring provider: Kathyrn Lass, MD Primary care provider: Kathyrn Lass, MD  Chief Complaint: Chronic Urticaria (2/3 mth f/u - have subsided. Patient states she still has the  itching feeling all over sensation)  History of Present Illness: I had the pleasure of seeing Alicia Russell for a follow up visit at the [facility] on 06/08/2021. She is a 46 y.o. female, who is being followed for chronic urticaria. Her previous allergy office visit was on 03/29/2021 with Dr. Nelva Bush. Today is a regular follow up visit.  History obtained from patient .   Chronic Urticaria:  At last visit she was found to have dermatographia some and allergy testing was deferred.  She started on Zyrtec 10 mg 1 tab twice a day with Pepcid 20 mg 1 tab twice a day.  Labs were significant for specific IgE to dust mite, roach.  Alpha-gal, nut panel, tryptase, CBC, CMP, thyroid antibodies, CU index were all normal.  She did have positive IgM to H. pylori.  She reports a history of H. Pylori which she was treated for a few years ago. She was also treated with antibiotics for H Pylori.  Denies any changes in reflux symptoms.    Today she reports urticaria has subsided, but has persistent generalized pruritus worsening over the past week.  Worsening symptoms associated with ocular itching and ear fullness.    Assessment and Plan: Alicia Russell is a 46 y.o. female with: Idiopathic urticaria Plan: Patient Instructions  Chronic Idiopathic Urticaria: - this is defined as hives lasting more than 6 weeks without an identifiable trigger - hives can be from a number of different sources including infections, allergies, vibration, temperature, pressure among many others other possible causes - often an identifiable cause is not determined - some potential triggers include: stress, illness, NSAIDs, aspirin, hormonal  changes - you do not have any red flag symptoms to make [place] concerned about secondary causes of hives and previous lab work up was unremarkable, except for IgM to H Pylori, which has been treated.  No further work up needed for this.  - approximately 50% of patients with chronic hives can have some associated swelling of the face/lips/eyelids (this is not a cause for alarm and does not typically progress onto systemic allergic reactions)  Therapy Plan:  - Increase Zyrtec to '20mg'$  twice daily for a week, then decrease to 10 mg twice daily for a week and continue this.  - Continue Pepcid '20mg'$  twice daily  - if hives are uncontrolled, increase zyrtec (cetirizine) to '10mg'$  twice daily - if hives remain uncontrolled, increase dose of zyrtec (cetirizine) to max dose of '20mg'$  (2 pills) twice daily- this is maximum dose - can increase or decrease dosing depending on symptom control to a maximum dose of 4 tablets of antihistamine daily. Wait until hives free for at least one month prior to decreasing dose.   - if hives are still uncontrolled with the above regimen, please arrange an appointment for discussion of Xolair (omalizumab)- an injectable medication for hives  Can use one of the following in place of zyrtec if desires: Claritin (loratadine) 10 mg, Xyzal (levocetirizine) 5 mg or Allegra (fexofenadine) 180 mg daily as needed   Follow up: 3 months   Thank you so much for letting me partake in your care today.  Don't hesitate to reach out if you have any additional concerns!  Roney Marion, MD  [facility] [place]  Return in about 3 months (around 09/08/2021).  No orders of the defined types were placed in this encounter.   Lab Orders  No laboratory test(s) ordered today   Diagnostics: None done    Medication List:  Current Outpatient Medications  Medication Sig Dispense Refill   acetaminophen (TYLENOL) 325 MG tablet Take 325 mg by mouth every 6 (six) hours as  needed for mild pain.     ALPRAZolam (XANAX) 1 MG tablet Take 0.5 mg by mouth daily as needed for anxiety.      amLODipine (NORVASC) 10 MG tablet Take 10 mg by mouth daily.      cetirizine (ZYRTEC) 10 MG tablet Take 1 tablet (10 mg total) by mouth 2 (two) times daily. 180 tablet 5   Cholecalciferol (VITAMIN D-3) 125 MCG (5000 UT) TABS Take 1 tablet by mouth daily.     famotidine (PEPCID) 20 MG tablet Take 1 tablet (20 mg total) by mouth 2 (two) times daily. 180 tablet 5   montelukast (SINGULAIR) 10 MG tablet Take 1 tablet (10 mg total) by mouth at bedtime. 90 tablet 5   Multiple Vitamin (MULTIVITAMIN ADULT PO) Take 1 tablet by mouth daily.     spironolactone (ALDACTONE) 50 MG tablet 1 tablet     Tazarotene (ARAZLO) 8.756 % LOTN 1 application     traZODone (DESYREL) 50 MG tablet Take 50 mg by mouth daily as needed for sleep.      Venlafaxine HCl 150 MG TB24 Take 150 mg by mouth daily.     venlafaxine XR (EFFEXOR-XR) 75 MG 24 hr capsule Take 75 mg by mouth daily.     vitamin A 3 MG (10000 UNITS) capsule 1 capsule     zolpidem (AMBIEN) 10 MG tablet Take 10 mg by mouth at bedtime.      Ferrous Sulfate (IRON) 325 (65 Fe) MG TABS Take 1 tablet by mouth daily.     omeprazole (PRILOSEC OTC) 20 MG tablet Take 1 tablet (20 mg total) by mouth 2 (two) times daily for 14 days. 28 tablet 0   No current facility-administered medications for this visit.   Allergies: Allergies  Allergen Reactions   Buspirone Other (See Comments)   Lexapro [Escitalopram] Other (See Comments)   Venlafaxine Other (See Comments)   I reviewed her past medical history, social history, family history, and environmental history and no significant changes have been reported from her previous visit.  ROS: All others negative except as noted per HPI.   Objective: BP 114/72   Pulse 83   Temp 97.9 F (36.6 C)   Resp 16   Ht '5\' 6"'$  (1.676 m)   Wt 199 lb 12.8 oz (90.6 kg)   SpO2 100%   BMI 32.25 kg/m  Body mass index is  32.25 kg/m. General Appearance:  Alert, cooperative, no distress, appears stated age  Head:  Normocephalic, without obvious abnormality, atraumatic  Eyes:  Conjunctiva clear, EOM's intact  Nose: Nares normal,   Throat: Lips, tongue normal; teeth and gums normal,   Neck: Supple, symmetrical  Lungs:   , Respirations unlabored, no coughing  Heart:  regular rate and rhythm and no murmur, Appears well perfused  Extremities: No edema  Skin: Skin color, texture, turgor normal, no rashes or lesions on visualized portions of skin  Neurologic: No gross deficits   Previous notes and tests were reviewed. The plan was reviewed with the patient/family, and all questions/concerned were  addressed.  It was my pleasure to see Alicia Russell today and participate in her care. Please feel free to contact me with any questions or concerns.  Sincerely,  Roney Marion, MD  Allergy & Immunology  [facility] [place] [facility] Office: [phone]

## 2021-06-08 NOTE — Patient Instructions (Addendum)
Chronic Idiopathic Urticaria: - this is defined as hives lasting more than 6 weeks without an identifiable trigger - hives can be from a number of different sources including infections, allergies, vibration, temperature, pressure among many others other possible causes - often an identifiable cause is not determined - some potential triggers include: stress, illness, NSAIDs, aspirin, hormonal changes - you do not have any red flag symptoms to make [place] concerned about secondary causes of hives and previous lab work up was unremarkable, except for IgM to H Pylori, which has been treated.  No further work up needed for this.  - approximately 50% of patients with chronic hives can have some associated swelling of the face/lips/eyelids (this is not a cause for alarm and does not typically progress onto systemic allergic reactions)  Therapy Plan:  - Increase Zyrtec to '20mg'$  twice daily for a week, then decrease to 10 mg twice daily for a week and continue this.  - Continue Pepcid '20mg'$  twice daily  - if hives are uncontrolled, increase zyrtec (cetirizine) to '10mg'$  twice daily - if hives remain uncontrolled, increase dose of zyrtec (cetirizine) to max dose of '20mg'$  (2 pills) twice daily- this is maximum dose - can increase or decrease dosing depending on symptom control to a maximum dose of 4 tablets of antihistamine daily. Wait until hives free for at least one month prior to decreasing dose.   - if hives are still uncontrolled with the above regimen, please arrange an appointment for discussion of Xolair (omalizumab)- an injectable medication for hives  Can use one of the following in place of zyrtec if desires: Claritin (loratadine) 10 mg, Xyzal (levocetirizine) 5 mg or Allegra (fexofenadine) 180 mg daily as needed   Follow up: 3 months   Thank you so much for letting me partake in your care today.  Don't hesitate to reach out if you have any additional concerns!  Roney Marion, MD  [facility]  [place] [place]

## 2021-08-15 ENCOUNTER — Encounter (INDEPENDENT_AMBULATORY_CARE_PROVIDER_SITE_OTHER): Payer: Self-pay

## 2021-12-26 ENCOUNTER — Other Ambulatory Visit: Payer: Self-pay | Admitting: Gastroenterology

## 2021-12-26 ENCOUNTER — Other Ambulatory Visit: Payer: Self-pay | Admitting: Family Medicine

## 2021-12-26 DIAGNOSIS — K769 Liver disease, unspecified: Secondary | ICD-10-CM

## 2022-01-26 ENCOUNTER — Ambulatory Visit
Admission: RE | Admit: 2022-01-26 | Discharge: 2022-01-26 | Disposition: A | Discharge status: Home / Self Care | Payer: 59 | Source: Ambulatory Visit | Attending: Gastroenterology | Admitting: Gastroenterology

## 2022-01-26 DIAGNOSIS — K769 Liver disease, unspecified: Secondary | ICD-10-CM

## 2022-01-26 MED ORDER — GADOPICLENOL 0.5 MMOL/ML IV SOLN
9.0000 mL | Freq: Once | INTRAVENOUS | Status: AC | PRN
  Administered 2022-01-26: 9 mL via INTRAVENOUS
